# Patient Record
Sex: Female | Born: 1940 | Race: White | Hispanic: No | Smoking: Former smoker
Health system: Southern US, Community
[De-identification: ages and names within clinical notes are randomized; demographics above are authoritative.]

## PROBLEM LIST (undated history)

## (undated) DIAGNOSIS — E039 Hypothyroidism, unspecified: Secondary | ICD-10-CM

## (undated) DIAGNOSIS — M1990 Unspecified osteoarthritis, unspecified site: Secondary | ICD-10-CM

## (undated) DIAGNOSIS — D649 Anemia, unspecified: Secondary | ICD-10-CM

## (undated) HISTORY — PX: ABDOMINAL HYSTERECTOMY: SHX81

## (undated) HISTORY — PX: FOOT FOREIGN BODY REMOVAL: SUR1116

## (undated) HISTORY — PX: APPENDECTOMY: SHX54

---

## 2013-01-13 ENCOUNTER — Other Ambulatory Visit (HOSPITAL_COMMUNITY): Payer: Self-pay | Admitting: Orthopaedic Surgery

## 2013-03-25 ENCOUNTER — Encounter (HOSPITAL_COMMUNITY): Payer: Self-pay | Admitting: Pharmacy Technician

## 2013-03-28 ENCOUNTER — Encounter (HOSPITAL_COMMUNITY)
Admission: RE | Admit: 2013-03-28 | Discharge: 2013-03-28 | Disposition: A | Discharge status: Home / Self Care | Payer: Medicare Other | Source: Ambulatory Visit | Attending: Orthopaedic Surgery | Admitting: Orthopaedic Surgery

## 2013-03-28 ENCOUNTER — Encounter (HOSPITAL_COMMUNITY): Payer: Self-pay

## 2013-03-28 HISTORY — DX: Unspecified osteoarthritis, unspecified site: M19.90

## 2013-03-28 HISTORY — DX: Anemia, unspecified: D64.9

## 2013-03-28 LAB — BASIC METABOLIC PANEL
CO2: 25 mEq/L (ref 19–32)
GFR calc non Af Amer: 83 mL/min — ABNORMAL LOW (ref >90)
Glucose, Bld: 89 mg/dL (ref 70–99)
Potassium: 5.3 mEq/L — ABNORMAL HIGH (ref 3.5–5.1)
Sodium: 137 mEq/L (ref 135–145)

## 2013-03-28 LAB — URINE MICROSCOPIC-ADD ON

## 2013-03-28 LAB — URINALYSIS, ROUTINE W REFLEX MICROSCOPIC
Glucose, UA: NEGATIVE mg/dL
Ketones, ur: 15 mg/dL — AB
Leukocytes, UA: NEGATIVE
Nitrite: POSITIVE — AB
Specific Gravity, Urine: 1.02 (ref 1.005–1.030)
pH: 5.5 (ref 5.0–8.0)

## 2013-03-28 LAB — SURGICAL PCR SCREEN: Staphylococcus aureus: NEGATIVE

## 2013-03-28 LAB — ABO/RH: ABO/RH(D): A POS

## 2013-03-28 LAB — CBC
Hemoglobin: 14.6 g/dL (ref 12.0–15.0)
MCH: 31.6 pg (ref 26.0–34.0)
RBC: 4.62 MIL/uL (ref 3.87–5.11)
WBC: 7.2 10*3/uL (ref 4.0–10.5)

## 2013-03-28 NOTE — Pre-Procedure Instructions (Signed)
Ann Rangel  03/28/2013   Your procedure is scheduled on:  04/05/13  Report to [facility] at 1030 AM.  Call this number if you have problems the morning of surgery: [phone]   Remember:   Do not eat food or drink liquids after midnight.   Take these medicines the morning of surgery with A SIP OF WATER: hydrocodone   Do not wear jewelry, make-up or nail polish.  Do not wear lotions, powders, or perfumes. You may wear deodorant.  Do not shave 48 hours prior to surgery. Men may shave face and neck.  Do not bring valuables to the hospital.  [facility] is not responsible                   for any belongings or valuables.  Contacts, dentures or bridgework may not be worn into surgery.  Leave suitcase in the car. After surgery it may be brought to your room.  For patients admitted to the hospital, checkout time is 11:00 AM the day of  discharge.   Patients discharged the day of surgery will not be allowed to drive  home.  Name and phone number of your driver: family  Special Instructions: Shower using CHG 2 nights before surgery and the night before surgery.  If you shower the day of surgery use CHG.  Use special wash - you have one bottle of CHG for all showers.  You should use approximately 1/3 of the bottle for each shower.   Please read over the following fact sheets that you were given: Pain Booklet, Coughing and Deep Breathing, Blood Transfusion Information, MRSA Information and Surgical Site Infection Prevention

## 2013-03-28 NOTE — Progress Notes (Signed)
K  5.3  Will review with [place] PA

## 2013-03-29 NOTE — Progress Notes (Signed)
Anesthesia Chart Review:  Patient is a 72 year old female scheduled for right TKA on 04/05/13 by Dr. Magnus Ivan.  History includes arthritis, anemia, former smoker, obesity, hysterectomy.  Preoperative labs noted.  K+ 5.3, Cr 0.77.  CBC WNL.  Preliminary urine culture showed gram negative rods--result called to Sherrie at Dr. Eliberto Ivory office.  PT/PTT were not done at her PAT visit, so these will need to be done on the day of surgery.    Velna Ochs [facility]/Anesthesiology Phone [phone] 03/29/2013 2:11 PM

## 2013-03-30 LAB — URINE CULTURE: Colony Count: 70000

## 2013-04-04 MED ORDER — CLINDAMYCIN PHOSPHATE 900 MG/50ML IV SOLN
900.0000 mg | INTRAVENOUS | Status: AC
  Administered 2013-04-05: 900 mg via INTRAVENOUS
  Filled 2013-04-04 (×2): qty 50

## 2013-04-05 ENCOUNTER — Inpatient Hospital Stay (HOSPITAL_COMMUNITY): Payer: Medicare Other | Admitting: Anesthesiology

## 2013-04-05 ENCOUNTER — Inpatient Hospital Stay (HOSPITAL_COMMUNITY): Payer: Medicare Other

## 2013-04-05 ENCOUNTER — Encounter (HOSPITAL_COMMUNITY)
Admission: RE | Disposition: A | Discharge status: Home / Self Care | Payer: Self-pay | Source: Ambulatory Visit | Attending: Orthopaedic Surgery

## 2013-04-05 ENCOUNTER — Inpatient Hospital Stay (HOSPITAL_COMMUNITY)
Admission: RE | Admit: 2013-04-05 | Discharge: 2013-04-08 | DRG: 470 | Disposition: A | Discharge status: Home / Self Care | Payer: Medicare Other | Source: Ambulatory Visit | Attending: Orthopaedic Surgery | Admitting: Orthopaedic Surgery

## 2013-04-05 ENCOUNTER — Encounter (HOSPITAL_COMMUNITY): Payer: Self-pay | Admitting: Vascular Surgery

## 2013-04-05 ENCOUNTER — Encounter (HOSPITAL_COMMUNITY): Payer: Self-pay | Admitting: *Deleted

## 2013-04-05 DIAGNOSIS — M1710 Unilateral primary osteoarthritis, unspecified knee: Principal | ICD-10-CM | POA: Diagnosis present

## 2013-04-05 DIAGNOSIS — Z87891 Personal history of nicotine dependence: Secondary | ICD-10-CM

## 2013-04-05 DIAGNOSIS — D62 Acute posthemorrhagic anemia: Secondary | ICD-10-CM | POA: Diagnosis not present

## 2013-04-05 DIAGNOSIS — Z01812 Encounter for preprocedural laboratory examination: Secondary | ICD-10-CM

## 2013-04-05 DIAGNOSIS — M1711 Unilateral primary osteoarthritis, right knee: Secondary | ICD-10-CM

## 2013-04-05 HISTORY — PX: TOTAL KNEE ARTHROPLASTY: SHX125

## 2013-04-05 LAB — PROTIME-INR
INR: 1.05 (ref 0.00–1.49)
Prothrombin Time: 13.6 seconds (ref 11.6–15.2)

## 2013-04-05 LAB — APTT: aPTT: 32 seconds (ref 24–37)

## 2013-04-05 SURGERY — ARTHROPLASTY, KNEE, TOTAL
Anesthesia: Choice | Site: Knee | Laterality: Right | Wound class: Clean

## 2013-04-05 MED ORDER — HYDROMORPHONE HCL PF 1 MG/ML IJ SOLN
0.2500 mg | INTRAMUSCULAR | Status: DC | PRN
  Administered 2013-04-05: 0.5 mg via INTRAVENOUS

## 2013-04-05 MED ORDER — PROPOFOL 10 MG/ML IV BOLUS
INTRAVENOUS | Status: DC | PRN
  Administered 2013-04-05: 200 mg via INTRAVENOUS

## 2013-04-05 MED ORDER — LIDOCAINE HCL (CARDIAC) 20 MG/ML IV SOLN
INTRAVENOUS | Status: DC | PRN
  Administered 2013-04-05: 100 mg via INTRAVENOUS

## 2013-04-05 MED ORDER — FENTANYL CITRATE 0.05 MG/ML IJ SOLN: 50.0000 ug | INTRAMUSCULAR | Status: DC | PRN

## 2013-04-05 MED ORDER — DOCUSATE SODIUM 100 MG PO CAPS
100.0000 mg | ORAL_CAPSULE | Freq: Two times a day (BID) | ORAL | Status: DC
  Administered 2013-04-05 – 2013-04-08 (×6): 100 mg via ORAL
  Filled 2013-04-05 (×7): qty 1

## 2013-04-05 MED ORDER — ACETAMINOPHEN 325 MG PO TABS
650.0000 mg | ORAL_TABLET | Freq: Four times a day (QID) | ORAL | Status: DC | PRN
  Administered 2013-04-06 – 2013-04-08 (×5): 650 mg via ORAL
  Filled 2013-04-05 (×5): qty 2

## 2013-04-05 MED ORDER — METHOCARBAMOL 100 MG/ML IJ SOLN
500.0000 mg | Freq: Four times a day (QID) | INTRAVENOUS | Status: DC | PRN
  Filled 2013-04-05: qty 5

## 2013-04-05 MED ORDER — CLINDAMYCIN PHOSPHATE 600 MG/50ML IV SOLN
600.0000 mg | Freq: Four times a day (QID) | INTRAVENOUS | Status: AC
  Administered 2013-04-05 – 2013-04-06 (×2): 600 mg via INTRAVENOUS
  Filled 2013-04-05 (×3): qty 50

## 2013-04-05 MED ORDER — MIDAZOLAM HCL 2 MG/2ML IJ SOLN
INTRAMUSCULAR | Status: AC
  Filled 2013-04-05: qty 2

## 2013-04-05 MED ORDER — RIVAROXABAN 10 MG PO TABS
10.0000 mg | ORAL_TABLET | Freq: Every day | ORAL | Status: DC
  Administered 2013-04-06 – 2013-04-08 (×3): 10 mg via ORAL
  Filled 2013-04-05 (×5): qty 1

## 2013-04-05 MED ORDER — HYDROMORPHONE HCL PF 1 MG/ML IJ SOLN
1.0000 mg | INTRAMUSCULAR | Status: DC | PRN
  Administered 2013-04-06 – 2013-04-07 (×2): 1 mg via INTRAVENOUS
  Filled 2013-04-05 (×2): qty 1

## 2013-04-05 MED ORDER — OXYCODONE HCL ER 10 MG PO T12A
10.0000 mg | EXTENDED_RELEASE_TABLET | Freq: Two times a day (BID) | ORAL | Status: DC
  Administered 2013-04-05 – 2013-04-06 (×3): 10 mg via ORAL
  Filled 2013-04-05 (×4): qty 1

## 2013-04-05 MED ORDER — METOCLOPRAMIDE HCL 5 MG/ML IJ SOLN: 5.0000 mg | Freq: Three times a day (TID) | INTRAMUSCULAR | Status: DC | PRN

## 2013-04-05 MED ORDER — SODIUM CHLORIDE 0.9 % IR SOLN
Status: DC | PRN
  Administered 2013-04-05: 3000 mL
  Administered 2013-04-05: 1000 mL

## 2013-04-05 MED ORDER — FENTANYL CITRATE 0.05 MG/ML IJ SOLN
INTRAMUSCULAR | Status: DC | PRN
  Administered 2013-04-05: 50 ug via INTRAVENOUS
  Administered 2013-04-05 (×2): 100 ug via INTRAVENOUS
  Administered 2013-04-05 (×2): 50 ug via INTRAVENOUS

## 2013-04-05 MED ORDER — ONDANSETRON HCL 4 MG/2ML IJ SOLN
INTRAMUSCULAR | Status: DC | PRN
  Administered 2013-04-05: 4 mg via INTRAVENOUS

## 2013-04-05 MED ORDER — METHOCARBAMOL 500 MG PO TABS
ORAL_TABLET | ORAL | Status: AC
  Administered 2013-04-05: 500 mg via ORAL
  Filled 2013-04-05: qty 1

## 2013-04-05 MED ORDER — HYDROMORPHONE HCL PF 1 MG/ML IJ SOLN
INTRAMUSCULAR | Status: AC
  Administered 2013-04-05: 0.5 mg via INTRAVENOUS
  Filled 2013-04-05: qty 1

## 2013-04-05 MED ORDER — SODIUM CHLORIDE 0.9 % IV SOLN: INTRAVENOUS | Status: DC

## 2013-04-05 MED ORDER — METOCLOPRAMIDE HCL 10 MG PO TABS: 5.0000 mg | ORAL_TABLET | Freq: Three times a day (TID) | ORAL | Status: DC | PRN

## 2013-04-05 MED ORDER — DIPHENHYDRAMINE HCL 12.5 MG/5ML PO ELIX: 12.5000 mg | ORAL_SOLUTION | ORAL | Status: DC | PRN

## 2013-04-05 MED ORDER — OXYCODONE HCL 5 MG/5ML PO SOLN: 5.0000 mg | Freq: Once | ORAL | Status: AC | PRN

## 2013-04-05 MED ORDER — VITAMIN B-12 1000 MCG PO TABS
1000.0000 ug | ORAL_TABLET | Freq: Every day | ORAL | Status: DC
  Administered 2013-04-05 – 2013-04-08 (×4): 1000 ug via ORAL
  Filled 2013-04-05 (×5): qty 1

## 2013-04-05 MED ORDER — MENTHOL 3 MG MT LOZG: 1.0000 | LOZENGE | OROMUCOSAL | Status: DC | PRN

## 2013-04-05 MED ORDER — OXYCODONE HCL 5 MG PO TABS
ORAL_TABLET | ORAL | Status: AC
  Administered 2013-04-05: 5 mg via ORAL
  Filled 2013-04-05: qty 1

## 2013-04-05 MED ORDER — METHOCARBAMOL 500 MG PO TABS
500.0000 mg | ORAL_TABLET | Freq: Four times a day (QID) | ORAL | Status: DC | PRN
  Administered 2013-04-05 – 2013-04-07 (×4): 500 mg via ORAL
  Filled 2013-04-05 (×5): qty 1

## 2013-04-05 MED ORDER — ONDANSETRON HCL 4 MG PO TABS: 4.0000 mg | ORAL_TABLET | Freq: Four times a day (QID) | ORAL | Status: DC | PRN

## 2013-04-05 MED ORDER — FERROUS SULFATE 325 (65 FE) MG PO TABS
325.0000 mg | ORAL_TABLET | Freq: Three times a day (TID) | ORAL | Status: DC
  Administered 2013-04-06 – 2013-04-08 (×5): 325 mg via ORAL
  Filled 2013-04-05 (×11): qty 1

## 2013-04-05 MED ORDER — MIDAZOLAM HCL 2 MG/2ML IJ SOLN: 1.0000 mg | INTRAMUSCULAR | Status: DC | PRN

## 2013-04-05 MED ORDER — ONDANSETRON HCL 4 MG/2ML IJ SOLN
4.0000 mg | Freq: Four times a day (QID) | INTRAMUSCULAR | Status: DC | PRN
  Administered 2013-04-05 – 2013-04-06 (×2): 4 mg via INTRAVENOUS
  Filled 2013-04-05 (×2): qty 2

## 2013-04-05 MED ORDER — OXYCODONE HCL 5 MG PO TABS
5.0000 mg | ORAL_TABLET | ORAL | Status: DC | PRN
  Administered 2013-04-05 – 2013-04-06 (×5): 10 mg via ORAL
  Administered 2013-04-06 – 2013-04-08 (×3): 5 mg via ORAL
  Filled 2013-04-05: qty 1
  Filled 2013-04-05 (×2): qty 2
  Filled 2013-04-05: qty 1
  Filled 2013-04-05 (×4): qty 2

## 2013-04-05 MED ORDER — LACTATED RINGERS IV SOLN
INTRAVENOUS | Status: DC
  Administered 2013-04-05 (×2): via INTRAVENOUS

## 2013-04-05 MED ORDER — ACETAMINOPHEN 650 MG RE SUPP: 650.0000 mg | Freq: Four times a day (QID) | RECTAL | Status: DC | PRN

## 2013-04-05 MED ORDER — BISACODYL 10 MG RE SUPP: 10.0000 mg | Freq: Every day | RECTAL | Status: DC | PRN

## 2013-04-05 MED ORDER — ZOLPIDEM TARTRATE 5 MG PO TABS: 5.0000 mg | ORAL_TABLET | Freq: Every evening | ORAL | Status: DC | PRN

## 2013-04-05 MED ORDER — FENTANYL CITRATE 0.05 MG/ML IJ SOLN
INTRAMUSCULAR | Status: AC
  Filled 2013-04-05: qty 2

## 2013-04-05 MED ORDER — POLYETHYLENE GLYCOL 3350 17 G PO PACK
17.0000 g | PACK | Freq: Every day | ORAL | Status: DC | PRN
  Filled 2013-04-05: qty 1

## 2013-04-05 MED ORDER — ONDANSETRON HCL 4 MG/2ML IJ SOLN: 4.0000 mg | Freq: Once | INTRAMUSCULAR | Status: DC | PRN

## 2013-04-05 MED ORDER — OXYCODONE HCL 5 MG PO TABS: 5.0000 mg | ORAL_TABLET | Freq: Once | ORAL | Status: AC | PRN

## 2013-04-05 MED ORDER — ALUM & MAG HYDROXIDE-SIMETH 200-200-20 MG/5ML PO SUSP
30.0000 mL | ORAL | Status: DC | PRN
  Administered 2013-04-07: 30 mL via ORAL
  Filled 2013-04-05 (×2): qty 30

## 2013-04-05 MED ORDER — ARTIFICIAL TEARS OP OINT
TOPICAL_OINTMENT | OPHTHALMIC | Status: DC | PRN
  Administered 2013-04-05: 1 via OPHTHALMIC

## 2013-04-05 MED ORDER — PHENOL 1.4 % MT LIQD: 1.0000 | OROMUCOSAL | Status: DC | PRN

## 2013-04-05 MED ORDER — PHENYLEPHRINE HCL 10 MG/ML IJ SOLN
INTRAMUSCULAR | Status: DC | PRN
  Administered 2013-04-05: 160 ug via INTRAVENOUS
  Administered 2013-04-05 (×4): 120 ug via INTRAVENOUS
  Administered 2013-04-05: 160 ug via INTRAVENOUS
  Administered 2013-04-05: 120 ug via INTRAVENOUS
  Administered 2013-04-05: 160 ug via INTRAVENOUS

## 2013-04-05 MED ORDER — MIDAZOLAM HCL 5 MG/5ML IJ SOLN
INTRAMUSCULAR | Status: DC | PRN
  Administered 2013-04-05: 2 mg via INTRAVENOUS

## 2013-04-05 MED ORDER — BUPIVACAINE-EPINEPHRINE PF 0.5-1:200000 % IJ SOLN
INTRAMUSCULAR | Status: DC | PRN
  Administered 2013-04-05: 25 mL

## 2013-04-05 SURGICAL SUPPLY — 69 items
ADH SKN CLS APL DERMABOND .7 (GAUZE/BANDAGES/DRESSINGS) ×1
BANDAGE ELASTIC 4 VELCRO ST LF (GAUZE/BANDAGES/DRESSINGS) ×1 IMPLANT
BANDAGE ELASTIC 6 VELCRO ST LF (GAUZE/BANDAGES/DRESSINGS) ×3 IMPLANT
BANDAGE ESMARK 6X9 LF (GAUZE/BANDAGES/DRESSINGS) ×1 IMPLANT
BLADE SAG 18X100X1.27 (BLADE) ×4 IMPLANT
BLADE SAW SGTL 13.0X1.19X90.0M (BLADE) ×2 IMPLANT
BNDG CMPR 9X6 STRL LF SNTH (GAUZE/BANDAGES/DRESSINGS) ×1
BNDG ESMARK 6X9 LF (GAUZE/BANDAGES/DRESSINGS) ×2
BOWL SMART MIX CTS (DISPOSABLE) ×1 IMPLANT
CEMENT BONE SIMPLEX SPEEDSET (Cement) ×2 IMPLANT
CLOTH BEACON ORANGE TIMEOUT ST (SAFETY) ×2 IMPLANT
COVER SURGICAL LIGHT HANDLE (MISCELLANEOUS) ×2 IMPLANT
CUFF TOURNIQUET SINGLE 34IN LL (TOURNIQUET CUFF) IMPLANT
CUFF TOURNIQUET SINGLE 44IN (TOURNIQUET CUFF) IMPLANT
DERMABOND ADVANCED (GAUZE/BANDAGES/DRESSINGS) ×1
DERMABOND ADVANCED .7 DNX12 (GAUZE/BANDAGES/DRESSINGS) IMPLANT
DRAPE INCISE IOBAN 66X45 STRL (DRAPES) ×2 IMPLANT
DRAPE ORTHO SPLIT 77X108 STRL (DRAPES) ×4
DRAPE PROXIMA HALF (DRAPES) ×2 IMPLANT
DRAPE SURG ORHT 6 SPLT 77X108 (DRAPES) ×2 IMPLANT
DRAPE U-SHAPE 47X51 STRL (DRAPES) ×2 IMPLANT
DRESSING OPSITE X SMALL 2X3 (GAUZE/BANDAGES/DRESSINGS) ×1 IMPLANT
DRSG MEPILEX BORDER 4X12 (GAUZE/BANDAGES/DRESSINGS) ×1 IMPLANT
DRSG PAD ABDOMINAL 8X10 ST (GAUZE/BANDAGES/DRESSINGS) ×2 IMPLANT
DURAPREP 26ML APPLICATOR (WOUND CARE) ×2 IMPLANT
ELECT REM PT RETURN 9FT ADLT (ELECTROSURGICAL) ×2
ELECTRODE REM PT RTRN 9FT ADLT (ELECTROSURGICAL) ×1 IMPLANT
EVACUATOR 1/8 PVC DRAIN (DRAIN) ×2 IMPLANT
FACESHIELD LNG OPTICON STERILE (SAFETY) ×6 IMPLANT
GAUZE XEROFORM 1X8 LF (GAUZE/BANDAGES/DRESSINGS) ×2 IMPLANT
GLOVE BIO SURGEON STRL SZ7.5 (GLOVE) ×4 IMPLANT
GLOVE BIOGEL PI IND STRL 8 (GLOVE) ×1 IMPLANT
GLOVE BIOGEL PI INDICATOR 8 (GLOVE) ×1
GLOVE ECLIPSE 7.0 STRL STRAW (GLOVE) ×2 IMPLANT
GLOVE ORTHO TXT STRL SZ7.5 (GLOVE) ×2 IMPLANT
GOWN PREVENTION PLUS LG XLONG (DISPOSABLE) IMPLANT
GOWN PREVENTION PLUS XLARGE (GOWN DISPOSABLE) ×4 IMPLANT
GOWN STRL NON-REIN LRG LVL3 (GOWN DISPOSABLE) ×4 IMPLANT
HANDPIECE INTERPULSE COAX TIP (DISPOSABLE) ×2
IMMOBILIZER KNEE 22 UNIV (SOFTGOODS) ×2 IMPLANT
KIT BASIN OR (CUSTOM PROCEDURE TRAY) ×2 IMPLANT
KIT ROOM TURNOVER OR (KITS) ×2 IMPLANT
KNEE/VIT E POLY LINER LEVEL 1B ×1 IMPLANT
MANIFOLD NEPTUNE II (INSTRUMENTS) ×2 IMPLANT
NDL SPNL 18GX3.5 QUINCKE PK (NEEDLE) ×1 IMPLANT
NEEDLE SPNL 18GX3.5 QUINCKE PK (NEEDLE) ×2 IMPLANT
NS IRRIG 1000ML POUR BTL (IV SOLUTION) ×2 IMPLANT
PACK TOTAL JOINT (CUSTOM PROCEDURE TRAY) ×2 IMPLANT
PAD ARMBOARD 7.5X6 YLW CONV (MISCELLANEOUS) ×4 IMPLANT
PADDING CAST COTTON 6X4 STRL (CAST SUPPLIES) ×2 IMPLANT
SET HNDPC FAN SPRY TIP SCT (DISPOSABLE) IMPLANT
SET PAD KNEE POSITIONER (MISCELLANEOUS) ×2 IMPLANT
SPONGE GAUZE 4X4 12PLY (GAUZE/BANDAGES/DRESSINGS) ×2 IMPLANT
STAPLER VISISTAT 35W (STAPLE) IMPLANT
STRIP CLOSURE SKIN 1/2X4 (GAUZE/BANDAGES/DRESSINGS) ×4 IMPLANT
SUCTION FRAZIER TIP 10 FR DISP (SUCTIONS) ×1 IMPLANT
SUT ETHILON 3 0 PS 1 (SUTURE) ×2 IMPLANT
SUT MNCRL AB 3-0 PS2 18 (SUTURE) ×1 IMPLANT
SUT VIC AB 0 CT1 27 (SUTURE) ×2
SUT VIC AB 0 CT1 27XBRD ANBCTR (SUTURE) IMPLANT
SUT VIC AB 1 CT1 27 (SUTURE) ×4
SUT VIC AB 1 CT1 27XBRD ANBCTR (SUTURE) ×2 IMPLANT
SUT VIC AB 2-0 CT1 27 (SUTURE) ×4
SUT VIC AB 2-0 CT1 TAPERPNT 27 (SUTURE) ×2 IMPLANT
SUT VIC AB 4-0 PS2 27 (SUTURE) ×2 IMPLANT
TOWEL OR 17X24 6PK STRL BLUE (TOWEL DISPOSABLE) ×2 IMPLANT
TOWEL OR 17X26 10 PK STRL BLUE (TOWEL DISPOSABLE) ×2 IMPLANT
TRAY FOLEY CATH 14FR (SET/KITS/TRAYS/PACK) ×1 IMPLANT
WATER STERILE IRR 1000ML POUR (IV SOLUTION) ×4 IMPLANT

## 2013-04-05 NOTE — Progress Notes (Signed)
Orthopedic Tech Progress Note Patient Details:  Ann Rangel [birth year] [id number] Applied overhead frame. CPM Right Knee CPM Right Knee: On Right Knee Flexion (Degrees): 60 Right Knee Extension (Degrees): 0   [name] 04/05/2013, 3:45 PM

## 2013-04-05 NOTE — Brief Op Note (Signed)
04/05/2013  2:37 PM  PATIENT:  Ann Rangel  72 y.o. female  PRE-OPERATIVE DIAGNOSIS:  Severe osteoarthritis right knee  POST-OPERATIVE DIAGNOSIS:  Severe osteoarthritis right knee   PROCEDURE:  Procedure(s): RIGHT TOTAL KNEE ARTHROPLASTY (Right)  SURGEON:  Surgeon(s) and Role:    * Kathryne Hitch, MD - Primary  PHYSICIAN ASSISTANT: Rexene Edison, PA-C  ANESTHESIA:   regional and general  EBL:  Total I/O In: 1000 [I.V.:1000] Out: 200 [Urine:100; Blood:100]  BLOOD ADMINISTERED:none  DRAINS: (medium) Hemovact drain(s) in the knee joint with  Suction Open   LOCAL MEDICATIONS USED:  NONE  SPECIMEN:  No Specimen  DISPOSITION OF SPECIMEN:  N/A  COUNTS:  YES  TOURNIQUET:   Total Tourniquet Time Documented: Thigh (Right) - 66 minutes Total: Thigh (Right) - 66 minutes   DICTATION: .Other Dictation: Dictation Number [id number]  PLAN OF CARE: Admit to inpatient   PATIENT DISPOSITION:  PACU - hemodynamically stable.   Delay start of Pharmacological VTE agent (>24hrs) due to surgical blood loss or risk of bleeding: not applicable

## 2013-04-05 NOTE — Anesthesia Postprocedure Evaluation (Signed)
  Anesthesia Post-op Note  Patient: Ann Rangel  Procedure(s) Performed: Procedure(s): RIGHT TOTAL KNEE ARTHROPLASTY (Right)  Patient Location: PACU  Anesthesia Type:GA combined with regional for post-op pain  Level of Consciousness: awake, oriented and patient cooperative  Airway and Oxygen Therapy: Patient Spontanous Breathing  Post-op Pain: moderate  Post-op Assessment: Post-op Vital signs reviewed, Patient's Cardiovascular Status Stable, Respiratory Function Stable, Patent Airway, No signs of Nausea or vomiting and Pain level controlled  Post-op Vital Signs: stable  Complications: No apparent anesthesia complications

## 2013-04-05 NOTE — Progress Notes (Signed)
UR COMPLETED  

## 2013-04-05 NOTE — Anesthesia Procedure Notes (Addendum)
Anesthesia Regional Block:  Femoral nerve block  Pre-Anesthetic Checklist: ,, timeout performed, Correct Patient, Correct Site, Correct Laterality, Correct Procedure, Correct Position, site marked, Risks and benefits discussed,  Surgical consent,  Pre-op evaluation,  At surgeon's request and post-op pain management  Laterality: Right and Lower  Prep: chloraprep       Needles:  Injection technique: Single-shot  Needle Type: Echogenic Needle     Needle Length: 9cm  Needle Gauge: 22 and 22 G    Additional Needles:  Procedures: ultrasound guided (picture in chart) Femoral nerve block Narrative:  Start time: 04/05/2013 12:34 PM End time: 04/05/2013 12:40 PM Injection made incrementally with aspirations every 5 mL.  Performed by: Personally  Anesthesiologist: Sheldon Silvan, MD  Femoral nerve block Procedure Name: LMA Insertion Date/Time: 04/05/2013 12:51 PM Performed by: De Nurse Pre-anesthesia Checklist: Patient identified, Timeout performed, Emergency Drugs available, Suction available and Patient being monitored Patient Re-evaluated:Patient Re-evaluated prior to inductionOxygen Delivery Method: Circle system utilized Preoxygenation: Pre-oxygenation with 100% oxygen Intubation Type: IV induction LMA: LMA inserted LMA Size: 5.0 Number of attempts: 1 Tube secured with: Tape Dental Injury: Teeth and Oropharynx as per pre-operative assessment

## 2013-04-05 NOTE — Preoperative (Signed)
Beta Blockers   Reason not to administer Beta Blockers:Not Applicable 

## 2013-04-05 NOTE — Anesthesia Preprocedure Evaluation (Signed)

## 2013-04-05 NOTE — Transfer of Care (Signed)
Immediate Anesthesia Transfer of Care Note  Patient: Ann Rangel  Procedure(s) Performed: Procedure(s): RIGHT TOTAL KNEE ARTHROPLASTY (Right)  Patient Location: PACU  Anesthesia Type:General  Level of Consciousness: awake, alert  and oriented  Airway & Oxygen Therapy: Patient Spontanous Breathing and Patient connected to nasal cannula oxygen  Post-op Assessment: Report given to PACU RN  Post vital signs: Reviewed and stable  Complications: No apparent anesthesia complications

## 2013-04-05 NOTE — H&P (Signed)
TOTAL KNEE ADMISSION H&P  Patient is being admitted for right total knee arthroplasty.  Subjective:  Chief Complaint:right knee pain.  HPI: Ann Rangel, 72 y.o. female, has a history of pain and functional disability in the right knee due to arthritis and has failed non-surgical conservative treatments for greater than 12 weeks to includeNSAID's and/or analgesics, corticosteriod injections, flexibility and strengthening excercises, use of assistive devices, weight reduction as appropriate and activity modification.  Onset of symptoms was gradual, starting 5 years ago with gradually worsening course since that time. The patient noted no past surgery on the right knee(s).  Patient currently rates pain in the right knee(s) at 8 out of 10 with activity. Patient has night pain, worsening of pain with activity and weight bearing, pain that interferes with activities of daily living, pain with passive range of motion, crepitus and joint swelling.  Patient has evidence of subchondral cysts, subchondral sclerosis, periarticular osteophytes and joint space narrowing by imaging studies. There is no active infection.  Patient Active Problem List   Diagnosis Date Noted  . Arthritis of knee, right 04/05/2013   Past Medical History  Diagnosis Date  . Arthritis   . Anemia     Past Surgical History  Procedure Laterality Date  . Abdominal hysterectomy    . Foot foreign body removal      Prescriptions prior to admission  Medication Sig Dispense Refill  . B Complex-C (SUPER B COMPLEX PO) Take 1 tablet by mouth daily.       . Calcium Carbonate-Vitamin D (CALTRATE 600+D PO) Take 1 tablet by mouth daily.       . Cholecalciferol (VITAMIN D-3 PO) Take 1 tablet by mouth daily.       . diclofenac sodium (VOLTAREN) 1 % GEL Apply 2 g topically daily as needed. Apply to knees for pain/inflammation      . ferrous sulfate 325 (65 FE) MG tablet Take 325 mg by mouth every other day.      Marland Kitchen HYDROcodone-acetaminophen  (NORCO/VICODIN) 5-325 MG per tablet Take 0.5 tablets by mouth every 12 (twelve) hours as needed for pain. For pain      . ibuprofen (ADVIL,MOTRIN) 200 MG tablet Take 400 mg by mouth daily as needed for pain. For pain      . Iron-Vitamins (GERITOL PO) Take 1 tablet by mouth daily.      . meloxicam (MOBIC) 7.5 MG tablet Take 7.5 mg by mouth daily.      . vitamin B-12 (CYANOCOBALAMIN) 1000 MCG tablet Take 1,000 mcg by mouth daily.      Marland Kitchen VITAMIN E PO Take 1 tablet by mouth daily.        Allergies  Allergen Reactions  . Penicillins Hives    History  Substance Use Topics  . Smoking status: Former Games developer  . Smokeless tobacco: Not on file  . Alcohol Use: No    History reviewed. No pertinent family history.   Review of Systems  Musculoskeletal: Positive for joint pain.  All other systems reviewed and are negative.    Objective:  Physical Exam  Constitutional: She is oriented to person, place, and time. She appears well-developed and well-nourished.  HENT:  Head: Normocephalic and atraumatic.  Eyes: EOM are normal. Pupils are equal, round, and reactive to light.  Neck: Normal range of motion. Neck supple.  Cardiovascular: Normal rate and regular rhythm.   Respiratory: Effort normal and breath sounds normal.  GI: Soft. Bowel sounds are normal.  Musculoskeletal:  Right knee: She exhibits swelling and effusion. Tenderness found. Medial joint line and lateral joint line tenderness noted.  Neurological: She is alert and oriented to person, place, and time.  Skin: Skin is warm and dry.  Psychiatric: She has a normal mood and affect.    Vital signs in last 24 hours: Temp:  [97.6 F (36.4 C)] 97.6 F (36.4 C) (06/10 1050) Pulse Rate:  [79] 79 (06/10 1050) Resp:  [20] 20 (06/10 1050) BP: (123)/(77) 123/77 mmHg (06/10 1050) SpO2:  [96 %] 96 % (06/10 1050)  Labs:   There is no weight on file to calculate BMI.   Imaging Review Plain radiographs demonstrate severe  degenerative joint disease of the right knee(s). The overall alignment ismild varus. The bone quality appears to be good for age and reported activity level.  Assessment/Plan:  End stage arthritis, right knee   The patient history, physical examination, clinical judgment of the provider and imaging studies are consistent with end stage degenerative joint disease of the right knee(s) and total knee arthroplasty is deemed medically necessary. The treatment options including medical management, injection therapy arthroscopy and arthroplasty were discussed at length. The risks and benefits of total knee arthroplasty were presented and reviewed. The risks due to aseptic loosening, infection, stiffness, patella tracking problems, thromboembolic complications and other imponderables were discussed. The patient acknowledged the explanation, agreed to proceed with the plan and consent was signed. Patient is being admitted for inpatient treatment for surgery, pain control, PT, OT, prophylactic antibiotics, VTE prophylaxis, progressive ambulation and ADL's and discharge planning. The patient is planning to be discharged home with home health services

## 2013-04-06 LAB — BASIC METABOLIC PANEL
CO2: 25 mEq/L (ref 19–32)
Chloride: 101 mEq/L (ref 96–112)
Creatinine, Ser: 0.83 mg/dL (ref 0.50–1.10)
GFR calc Af Amer: 80 mL/min — ABNORMAL LOW (ref >90)
Potassium: 3.7 mEq/L (ref 3.5–5.1)
Sodium: 136 mEq/L (ref 135–145)

## 2013-04-06 LAB — CBC
Platelets: 185 10*3/uL (ref 150–400)
RBC: 3.29 MIL/uL — ABNORMAL LOW (ref 3.87–5.11)
RDW: 14 % (ref 11.5–15.5)
WBC: 8.5 10*3/uL (ref 4.0–10.5)

## 2013-04-06 NOTE — Evaluation (Signed)
Physical Therapy Evaluation Patient Details Name: Ann Rangel MRN: [id number] DOB: [birth year] Today's Date: 04/06/2013 Time: 7829-5621 PT Time Calculation (min): 17 min  PT Assessment / Plan / Recommendation Clinical Impression  Pt is a 72 y.o. female s/p R TKA POD#1. Pt presents with mobility deficits, decreased ROM and Strength in R KNee and increased pain in R knee. Will benefit from skilled PT to maximize functional mobility and ensure safe transition to next venue of care. pt plans to D/C home with daughter; discussed with pt the benefits fof ST-SNF if PT goals are not made. Pt is receptive to the idea but ultimately hopes to D/C home.     PT Assessment  Patient needs continued PT services    Follow Up Recommendations  Home health PT;Supervision/Assistance - 24 hour    Does the patient have the potential to tolerate intense rehabilitation      Barriers to Discharge None      Equipment Recommendations  Rolling walker with 5" wheels;Other (comment) (3 in1 commode)    Recommendations for Other Services OT consult   Frequency 7X/week    Precautions / Restrictions Precautions Precautions: Fall;Knee Precaution Booklet Issued: Yes (comment) Precaution Comments: given knee exercise protocol handout Required Braces or Orthoses: Knee Immobilizer - Right Knee Immobilizer - Right: Other (comment) (MD has not specified ) Restrictions Weight Bearing Restrictions: Yes RLE Weight Bearing: Weight bearing as tolerated   Pertinent Vitals/Pain Denies pain; c/o nausea. RN notified.       Mobility  Bed Mobility Bed Mobility: Supine to Sit;Sitting - Scoot to Edge of Bed Supine to Sit: 4: Min assist;HOB elevated;With rails Sitting - Scoot to Edge of Bed: 4: Min assist Details for Bed Mobility Assistance: transfered to EOB on L side of bed(A) to advance R LE to/off EOB and control descent of R LE to floor; pt required verbal cues for hand placement and sequencing and hand rails and  increased time due to pain Transfers Transfers: Sit to Stand;Stand to Sit Sit to Stand: 1: +2 Total assist;From bed;From elevated surface;With upper extremity assist Sit to Stand: Patient Percentage: 60% Stand to Sit: 1: +2 Total assist;To chair/3-in-1;With upper extremity assist;With armrests Stand to Sit: Patient Percentage: 70% Details for Transfer Assistance: pt required 2+ (A) due to pain in R LE and decreased ability to WB through RLE secondary to pain; pt unsteady intially but no LOB; required verbal cues for hand placement and safety with RW; initailly pt was standing with trunk flexed; correctable with verbal cues  Ambulation/Gait Ambulation/Gait Assistance: 3: Mod assist Ambulation Distance (Feet): 8 Feet Assistive device: Rolling walker Ambulation/Gait Assistance Details: continuous verbal cues for upright posture, gt sequencing; pt demo difficulty advance R LE and required facilitation ~ 50% of time; pt c/o dizziness and nauseousness and required sitting rest break.   Gait Pattern: Step-to pattern;Decreased stance time - right;Decreased step length - left;Antalgic;Trunk flexed Gait velocity: decreased Stairs: No Wheelchair Mobility Wheelchair Mobility: No    Exercises Total Joint Exercises Ankle Circles/Pumps: AROM;Both;10 reps;Supine Heel Slides: AAROM;Right;10 reps;Supine (limited by pain ) Hip ABduction/ADduction: AAROM;Right;10 reps;Seated Knee Flexion: AAROM;Right;10 reps;Supine Goniometric ROM: 4 to 45 in supine AAROM limited by pain   PT Diagnosis: Difficulty walking;Acute pain  PT Problem List: Decreased strength;Decreased range of motion;Decreased balance;Decreased mobility;Decreased activity tolerance;Decreased knowledge of use of DME;Pain PT Treatment Interventions: DME instruction;Gait training;Stair training;Functional mobility training;Therapeutic activities;Therapeutic exercise;Balance training;Neuromuscular re-education;Patient/family education   PT  Goals Acute Rehab PT Goals PT Goal Formulation: With patient Time For Goal  Achievement: 04/13/13 Potential to Achieve Goals: Good Pt will go Supine/Side to Sit: with modified independence PT Goal: Supine/Side to Sit - Progress: Goal set today Pt will go Sit to Supine/Side: with modified independence PT Goal: Sit to Supine/Side - Progress: Goal set today Pt will go Stand to Sit: with modified independence PT Goal: Stand to Sit - Progress: Goal set today Pt will Ambulate: with supervision;with rolling walker;>150 feet PT Goal: Ambulate - Progress: Goal set today Pt will Go Up / Down Stairs: 3-5 stairs;with supervision;with rail(s);with least restrictive assistive device PT Goal: Up/Down Stairs - Progress: Goal set today Pt will Perform Home Exercise Program: Independently PT Goal: Perform Home Exercise Program - Progress: Goal set today  Visit Information  Last PT Received On: 04/06/13 Assistance Needed: +2    Subjective Data  Subjective: pt lying supine; reports "i am just so tired and sleepy from all this pain medicince but ill try" Patient Stated Goal: home with daughter    Prior Functioning  Home Living Lives With: Alone Available Help at Discharge: Family;Available 24 hours/day Type of Home: House Home Access: Stairs to enter [employer] of Steps: 4 Entrance Stairs-Rails: Can reach both Home Layout: One level Bathroom Shower/Tub: Forensic scientist: Standard Bathroom Accessibility: Yes How Accessible: Accessible via walker Home Adaptive Equipment: None Prior Function Level of Independence: Independent Able to Take Stairs?: Yes Driving: Yes Vocation: Full time employment Communication Communication: No difficulties Dominant Hand: Right    Cognition  Cognition Arousal/Alertness: Awake/alert Behavior During Therapy: WFL for tasks assessed/performed Overall Cognitive Status: Within Functional Limits for tasks assessed     Extremity/Trunk Assessment Right Lower Extremity Assessment RLE ROM/Strength/Tone: Due to pain;Due to precautions;Unable to fully assess (DF/PF WFl) RLE Sensation: WFL - Light Touch Left Lower Extremity Assessment LLE ROM/Strength/Tone: WFL for tasks assessed LLE Sensation: WFL - Light Touch Trunk Assessment Trunk Assessment: Kyphotic   Balance Balance Balance Assessed: Yes Static Sitting Balance Static Sitting - Balance Support: Right upper extremity supported (R LE unsupported L supported) Static Sitting - Level of Assistance: 5: Stand by assistance Static Sitting - Comment/# of Minutes: pt tolerated sitting EOB ~33min while inital dizziness subsided with deep breathing techniques   End of Session PT - End of Session Equipment Utilized During Treatment: Gait belt;Right knee immobilizer Activity Tolerance: Patient limited by pain;Treatment limited secondary to medication (nausea ) Patient left: in chair;with call bell/phone within reach Nurse Communication: Mobility status;Other (comment) (pt requests medication for nausea ) CPM Right Knee CPM Right Knee: [address]  GP     Donnamarie Poag Brooks, [place] [phone] 04/06/2013, 11:13 AM

## 2013-04-06 NOTE — Progress Notes (Signed)
Subjective: 1 Day Post-Op Procedure(s) (LRB): RIGHT TOTAL KNEE ARTHROPLASTY (Right) Patient reports pain as moderate.    Objective: Vital signs in last 24 hours: Temp:  [97.6 F (36.4 C)-98.6 F (37 C)] 97.7 F (36.5 C) (06/11 0546) Pulse Rate:  [66-86] 86 (06/11 0546) Resp:  [9-20] 18 (06/11 0546) BP: (111-123)/(46-77) 112/46 mmHg (06/11 0546) SpO2:  [93 %-100 %] 98 % (06/11 0546)  Intake/Output from previous day: 06/10 0701 - 06/11 0700 In: 2500 [P.O.:600; I.V.:1900] Out: 1025 [Urine:700; Drains:225; Blood:100] Intake/Output this shift:     Recent Labs  04/06/13 0410  HGB 10.1*    Recent Labs  04/06/13 0410  WBC 8.5  RBC 3.29*  HCT 30.3*  PLT 185    Recent Labs  04/06/13 0410  NA 136  K 3.7  CL 101  CO2 25  BUN 12  CREATININE 0.83  GLUCOSE 106*  CALCIUM 8.2*    Recent Labs  04/05/13 1125  INR 1.05    Sensation intact distally Intact pulses distally Dorsiflexion/Plantar flexion intact Incision: dressing C/D/I No cellulitis present Compartment soft  Assessment/Plan: 1 Day Post-Op Procedure(s) (LRB): RIGHT TOTAL KNEE ARTHROPLASTY (Right) Up with therapy  Keishla Oyer Y 04/06/2013, 7:21 AM

## 2013-04-06 NOTE — Progress Notes (Signed)
Physical Therapy Treatment Patient Details Name: Ann Rangel MRN: [id number] DOB: 02/07/1941 Today's Date: 04/06/2013 Time: 8295-6213 PT Time Calculation (min): 24 min  PT Assessment / Plan / Recommendation Comments on Treatment Session  pt moving well with therapy this afternoon. no c/o dizziniess or nausea this afternoon. Pt able to increase independence with mobility and increase amb distance. Pt is highly motivated to D/C home with family vs [facility]. Will cont to f/u with pt to maximzie functional mobility.'    Follow Up Recommendations  Home health PT;Supervision/Assistance - 24 hour     Does the patient have the potential to tolerate intense rehabilitation     Barriers to Discharge        Equipment Recommendations  Rolling walker with 5" wheels;Other (comment)    Recommendations for Other Services OT consult  Frequency 7X/week   Plan Discharge plan remains appropriate;Frequency remains appropriate    Precautions / Restrictions Precautions Precautions: Fall;Knee Precaution Booklet Issued: Yes (comment) Precaution Comments: given knee exercise protocol handout Required Braces or Orthoses: Knee Immobilizer - Right Knee Immobilizer - Right: Other (comment) (MD has not specified ) Restrictions Weight Bearing Restrictions: Yes RLE Weight Bearing: Weight bearing as tolerated   Pertinent Vitals/Pain 7/10; pt tolerated CPM at 63degrees; pt and family educated on CPM wear schedule and on/off button for CPM     Mobility  Bed Mobility Bed Mobility: Sit to Supine Sit to Supine: 4: Min assist;HOB flat Details for Bed Mobility Assistance: (A) to advance R LE onto bed; verbal cues for hand placement and sequencing; instructed pt on hooklying technique to increase indepence with bed mobility, pt unable to perform at this time. Transfers Transfers: Sit to Stand;Stand to Sit Sit to Stand: 4: Min assist;With armrests;From chair/3-in-1;With upper extremity assist Stand to Sit: 4: Min  assist;To bed;With upper extremity assist;To elevated surface Details for Transfer Assistance: required min (A) to transfer sit <> stand; pt required verbal cues for hand placement and safety; pt tends to attempt to pull to stand with RW correctable with cues; required min (A) to steady and complete transfer due to increased pain and decreased ability to fully WB through R LE  Ambulation/Gait Ambulation/Gait Assistance: 4: Min assist Ambulation Distance (Feet): 30 Feet Assistive device: Rolling walker Ambulation/Gait Assistance Details: verbal cues for upright posture and gt sequencing; demo given for proper gt sequencing to equalize stride length and increase heel strike on R LE; pt required 1 standing rest break due to fatigue in UEs. Gait Pattern: Step-to pattern;Decreased stance time - right;Decreased step length - left;Antalgic;Trunk flexed Gait velocity: decreased Stairs: No Wheelchair Mobility Wheelchair Mobility: No    Exercises Total Joint Exercises Ankle Circles/Pumps: AROM;Both;10 reps;Supine Hip ABduction/ADduction: AAROM;Right;Supine;10 reps Long Arc Quad: AAROM;Right;10 reps;Supine   PT Diagnosis:    PT Problem List:   PT Treatment Interventions:     PT Goals Acute Rehab PT Goals PT Goal Formulation: With patient Time For Goal Achievement: 04/13/13 Potential to Achieve Goals: Good PT Goal: Sit to Supine/Side - Progress: Progressing toward goal PT Goal: Stand to Sit - Progress: Progressing toward goal PT Goal: Ambulate - Progress: Progressing toward goal PT Goal: Perform Home Exercise Program - Progress: Progressing toward goal  Visit Information  Last PT Received On: 04/06/13 Assistance Needed: +1    Subjective Data  Subjective: pt sitting in chair with family present reports that she feels better this afternoon, and is not taking as much pain medicine because it makes her too sick  Patient Stated Goal: home  with family    Cognition   Cognition Arousal/Alertness: Awake/alert Behavior During Therapy: WFL for tasks assessed/performed Overall Cognitive Status: Within Functional Limits for tasks assessed    Balance  Balance Balance Assessed: No  End of Session PT - End of Session Equipment Utilized During Treatment: Gait belt;Right knee immobilizer Activity Tolerance: Patient tolerated treatment well Patient left: in bed;in CPM;with family/visitor present;with call bell/phone within reach Nurse Communication: Mobility status   GP     Donell Sievert, [place] [phone] 04/06/2013, 3:05 PM

## 2013-04-06 NOTE — Op Note (Signed)
NAMETEMPEST, [place]ANK[place] NO.:  [id number]  MEDICAL RECORD NO.:  [id number]  LOCATION:  5N24C                        FACILITY:  [facility]  PHYSICIAN:  Vanita Panda. Magnus Ivan, M.D.DATE OF BIRTH:  18-Aug-1941  DATE OF PROCEDURE:  04/05/2013 DATE OF DISCHARGE:                              OPERATIVE REPORT   PREOPERATIVE DIAGNOSIS:  Severe end-stage arthritis and degenerative joint disease, right knee.  POSTOPERATIVE DIAGNOSIS:  Severe end-stage arthritis and degenerative joint disease, right knee.  PROCEDURE:  Right total knee arthroplasty.  IMPLANTS:  Stryker triathlon knee with size 4 femur, size 5 tibial tray, 9 mm polyethylene insert, size 29 patellar button.  SURGEON:  Vanita Panda. Magnus Ivan, M.D.  ASSISTANT:  Richardean Canal, P.A.  ANESTHESIA: 1. Femoral nerve block, right leg. 2. General.  TOURNIQUET TIME:  Less than 2 hours.  ESTIMATED BLOOD LOSS:  100 mL.  ANTIBIOTICS:  900 mg of IV clindamycin.  COMPLICATIONS:  None.  INDICATIONS:  Ms. Tailor is a 72 year old female that I have known for many years now.  She has severe end-stage arthritis of her right knee with bone-on-bone wear.  She has tried all sorts of conservative treatment including walker, assisted device, activity modification, multiple injections, as well as strengthening exercises, and has gotten to the point were x-rays show complete loss of medial joint line and intractable arthritis in all compartments of the knee.  Given failure of conservative treatment, her daily pain, her decreased mobility and, the impact of these of her quality of life, she wished to proceed with a total knee arthroplasty.  The risks of surgery include acute blood loss anemia, DVT, nerve and vessel injury, infection, fracture.  The goals of surgery are decreased pain, improved mobility, and improved quality of life.  DESCRIPTION OF PROCEDURE:  After informed consent was obtained, right leg was marked.   Anesthesia was obtained with femoral nerve block.  She was then brought to the operating room, placed supine on the operative table.  General anesthesia was then obtained.  Foley catheter placed. Nonsterile tourniquet was placed on her upper right leg.  Her right thigh, leg, foot, and ankle were then prepped and draped with DuraPrep and sterile drapes.  Time-out was called and she was identified as correct patient, correct right leg.  I then used an Esmarch to wrap out the leg and tourniquet was inflated to 300 mm of pressure.  I made a midline incision directly over the patella and carried this proximally and distally and dissected down to the knee joint.  A medial parapatellar arthrotomy was carried out.  We drained decent effusion from the knee.  Then we found the medial compartment to be completely devoid of cartilage with hard sclerotic bone and osteophytes peripherally throughout the knee.  We removed all of these and then first referenced all the tibia for making a tibial cut.  We set our guide for neutral slope, adjusted for varus and valgus and taking about 9 mm off the high side.  Once we made our tibial cut, we felt that she is going to be quite tight, so we took 2 more mm off this.  We then used an intramedullary guide for the femur  and drilled for this.  We used a 10-degree distal femoral cut set at external rotation to 5 degrees. Once we made this cut, we then brought the knee down to extension and placed the extension block in the knee and I was pleased with her having full extension.  We then went back to the femur and set our sizing guide off the epicondylar axis and sized for a size 4 femur.  We then made our anterior and posterior cuts and a chamfer cuts of this 4 and then made our femoral box cut and drilled lugs for this.  We then went back to the tibia and trialed for size 4 tibia and I liked the coverage of the size 5.  We drilled the keel for this and then with a 4  trial femur in place, 9 mm polyethylene insert for a 5 tibia, we put the knee through range of motion with the trial compartments in place and I was very pleased with the stability and range of motion.  We then cut our patella and drilled lugs for size 29 patellar button.  We then removed all trial components and copiously irrigated the knee with normal saline solution using pulsatile lavage.  We then cemented the real Stryker triathlon size 5 tibia followed by the real size 4 femur.  We placed the real 9 mm polyethylene insert with size 5 tibia, cemented the 29 mm patellar button.  Once the cement had dried, we let the tourniquet down. Hemostasis was obtained with electrocautery.  We placed a medium Hemovac in the knee joint and copiously irrigated the knee with normal saline solution using pulsatile lavage.  We then closed the arthrotomy with interrupted #1 Vicryl suture followed by 0-Vicryl in the deep tissue, 2- 0 Vicryl in the subcutaneous tissue, 4-0 Monocryl subcuticular stitch, Dermabond, and well-padded sterile dressing.  Knee immobilizer was placed as well.  She was awakened, extubated, and taken to the recovery room in stable condition.  All final counts were correct.  There were no complications noted.  Of note, Kriste Basque, PA-C was present during the entire case and his presence was crucial for retracting for patient positioning for helping position the implants and for closure of the wound.     Vanita Panda. Magnus Ivan, M.D.     CYB/MEDQ  D:  04/05/2013  T:  04/06/2013  Job:  [id number]

## 2013-04-07 LAB — CBC
HCT: 27.5 % — ABNORMAL LOW (ref 36.0–46.0)
Hemoglobin: 9.2 g/dL — ABNORMAL LOW (ref 12.0–15.0)
MCV: 91.7 fL (ref 78.0–100.0)
RBC: 3 MIL/uL — ABNORMAL LOW (ref 3.87–5.11)
RDW: 14.1 % (ref 11.5–15.5)
WBC: 11.3 10*3/uL — ABNORMAL HIGH (ref 4.0–10.5)

## 2013-04-07 MED ORDER — ASPIRIN EC 325 MG PO TBEC: 325.0000 mg | DELAYED_RELEASE_TABLET | Freq: Two times a day (BID) | ORAL | Status: DC

## 2013-04-07 MED ORDER — OXYCODONE-ACETAMINOPHEN 5-325 MG PO TABS: 1.0000 | ORAL_TABLET | ORAL | Status: DC | PRN

## 2013-04-07 NOTE — Care Management Note (Signed)
CARE MANAGEMENT NOTE 04/07/2013  Patient:  Ann Rangel, PAZ   Account Number:  [id number]  Date Initiated:  04/07/2013  Documentation initiated by:  Vance Peper  Subjective/Objective Assessment:   73 yr old female s/p right total knee arthroplasty.     Action/Plan:   Patient preoperatively setup with [facility]. Has DME, CPM to be delivered. CM folowing to see if patient will need SNF.   Anticipated DC Date:  04/08/2013   Anticipated DC Plan:           Choice offered to / List presented to:        DME agency  [employer]     HH arranged  HH-2 PT  HH-3 OT      [facility] agency  [facility]   Status of service:  In process, will continue to follow Medicare Important Message given?   (If response is "NO", the following Medicare IM given date fields will be blank) Date Medicare IM given:   Date Additional Medicare IM given:    Discharge Disposition:    Per UR Regulation:    If discussed at Long Length of Stay Meetings, dates discussed:    Comments:

## 2013-04-07 NOTE — Progress Notes (Signed)
Physical Therapy Treatment Patient Details Name: Ann Rangel MRN: [id number] DOB: 1941-09-20 Today's Date: 04/07/2013 Time: 6578-4696 PT Time Calculation (min): 26 min  PT Assessment / Plan / Recommendation Comments on Treatment Session  Patient making alot better progress this afternoon than this morning. Stated she had backed off some of the pain meds and is not longer feeling dizzy. If patient continues to progress she should be able to DC home when ready. Continue with current POC    Follow Up Recommendations  Home health PT;Supervision/Assistance - 24 hour     Does the patient have the potential to tolerate intense rehabilitation     Barriers to Discharge        Equipment Recommendations  Rolling walker with 5" wheels;Other (comment)    Recommendations for Other Services    Frequency 7X/week   Plan Discharge plan remains appropriate;Frequency remains appropriate    Precautions / Restrictions Precautions Precautions: Fall;Knee Precaution Comments: Reviewed precautions Required Braces or Orthoses: Knee Immobilizer - Right Knee Immobilizer - Right: Other (comment) (MD has not specified) Restrictions Weight Bearing Restrictions: Yes RLE Weight Bearing: Weight bearing as tolerated   Pertinent Vitals/Pain     Mobility  Bed Mobility Bed Mobility: Not assessed Supine to Sit: 4: Min guard;With rails Sitting - Scoot to Edge of Bed: 4: Min guard Details for Bed Mobility Assistance: increased time but patient completing without physical assist Transfers Transfers: Sit to Stand;Stand to Sit Sit to Stand: 4: Min assist;With upper extremity assist;From chair/3-in-1 Stand to Sit: 4: Min assist;With upper extremity assist;To chair/3-in-1 Details for Transfer Assistance: A to intiate stand and Min A and cues to control decent to recliner chair. Cues for hand placement and safety with walker Ambulation/Gait Ambulation/Gait Assistance: 4: Min guard Ambulation Distance (Feet): 40  Feet Assistive device: Rolling walker Ambulation/Gait Assistance Details: CUes for upright posture. One seated rest break. Patient with better quality of gait this session Gait Pattern: Step-to pattern;Decreased stance time - right;Decreased step length - left Gait velocity: decreased    Exercises Total Joint Exercises Quad Sets: AROM;Right;10 reps Heel Slides: AAROM;Right;10 reps;Supine Hip ABduction/ADduction: AAROM;Right;Supine;10 reps Long Arc Quad: AAROM;Right;10 reps;Supine Knee Flexion: AAROM;Right;10 reps;Supine   PT Diagnosis:    PT Problem List:   PT Treatment Interventions:     PT Goals Acute Rehab PT Goals PT Goal: Supine/Side to Sit - Progress: Progressing toward goal PT Goal: Stand to Sit - Progress: Progressing toward goal PT Goal: Ambulate - Progress: Progressing toward goal PT Goal: Perform Home Exercise Program - Progress: Progressing toward goal  Visit Information  Last PT Received On: 04/07/13 Assistance Needed: +1    Subjective Data      Cognition  Cognition Arousal/Alertness: Awake/alert Behavior During Therapy: WFL for tasks assessed/performed Overall Cognitive Status: Within Functional Limits for tasks assessed    Balance     End of Session PT - End of Session Equipment Utilized During Treatment: Gait belt Activity Tolerance: Patient tolerated treatment well Patient left: in chair;with call bell/phone within reach;with family/visitor present Nurse Communication: Mobility status   GP     Fredrich Birks 04/07/2013, 2:42 PM  04/07/2013 Fredrich Birks PTA [phone] pager [phone] office

## 2013-04-07 NOTE — Evaluation (Signed)
Occupational Therapy Evaluation Patient Details Name: Ann Rangel MRN: [id number] DOB: [birth year] Today's Date: 04/07/2013 Time: 4098-1191 OT Time Calculation (min): 14 min  OT Assessment / Plan / Recommendation Clinical Impression    Pt is a 72 y.o. female s/p R TKA. Pt presents with below problem list. Will benefit from skilled OT to maximize independence prior to d/c.     OT Assessment  Patient needs continued OT Services    Follow Up Recommendations  Home health OT;Supervision/Assistance - 24 hour    Barriers to Discharge      Equipment Recommendations  None recommended by OT    Recommendations for Other Services    Frequency  Min 2X/week    Precautions / Restrictions Precautions Precautions: Fall;Knee Precaution Comments: Reviewed precautions Required Braces or Orthoses: Knee Immobilizer - Right Knee Immobilizer - Right: Other (comment) (MD has not specified) Restrictions Weight Bearing Restrictions: Yes RLE Weight Bearing: Weight bearing as tolerated   Pertinent Vitals/Pain Pain 5/10 in knee. Repositioned.     ADL  Eating/Feeding: Independent Where Assessed - Eating/Feeding: Chair Grooming: Performed;Wash/dry hands;Min guard Where Assessed - Grooming: Unsupported standing Upper Body Bathing: Set up Where Assessed - Upper Body Bathing: Supported sitting Lower Body Bathing: Moderate assistance Where Assessed - Lower Body Bathing: Supported sit to stand Upper Body Dressing: Set up Where Assessed - Upper Body Dressing: Supported sitting Lower Body Dressing: Moderate assistance Where Assessed - Lower Body Dressing: Supported sit to Pharmacist, hospital: Performed;Minimal Dentist Method: Sit to Barista: Raised toilet seat with arms (or 3-in-1 over toilet) Toileting - Clothing Manipulation and Hygiene: Min guard Where Assessed - Toileting Clothing Manipulation and Hygiene: Sit to stand from 3-in-1 or toilet Tub/Shower  Transfer Method: Not assessed Equipment Used: Gait belt;Knee Immobilizer;Rolling walker Transfers/Ambulation Related to ADLs: Minguard for ambulation and Min A for transfers ADL Comments: Pt unable to reach right foot to don/doff sock. Pt overall Mod A level for LB ADLs. Pt performed toileting and also grooming at sink. Pt requiring Min A for transfers. Pt interested in AE. Will review next session.    OT Diagnosis: Acute pain  OT Problem List: Decreased strength;Decreased range of motion;Decreased activity tolerance;Impaired balance (sitting and/or standing);Decreased safety awareness;Decreased knowledge of use of DME or AE;Decreased knowledge of precautions;Pain OT Treatment Interventions: Self-care/ADL training;DME and/or AE instruction;Therapeutic activities;Patient/family education;Balance training   OT Goals Acute Rehab OT Goals OT Goal Formulation: With patient Time For Goal Achievement: 04/14/13 Potential to Achieve Goals: Good ADL Goals Pt Will Perform Lower Body Bathing: with modified independence;Sit to stand from chair ADL Goal: Lower Body Bathing - Progress: Goal set today Pt Will Perform Lower Body Dressing: with modified independence;Sit to stand from chair;Sit to stand from bed ADL Goal: Lower Body Dressing - Progress: Goal set today Pt Will Transfer to Toilet: with modified independence;Ambulation;with DME ADL Goal: Toilet Transfer - Progress: Goal set today Pt Will Perform Toileting - Clothing Manipulation: with modified independence;Standing ADL Goal: Toileting - Clothing Manipulation - Progress: Goal set today Pt Will Perform Toileting - Hygiene: with modified independence;Sit to stand from 3-in-1/toilet;Sitting on 3-in-1 or toilet ADL Goal: Toileting - Hygiene - Progress: Goal set today Pt Will Perform Tub/Shower Transfer: Tub transfer;with supervision;Ambulation;with DME ADL Goal: Tub/Shower Transfer - Progress: Goal set today  Visit Information  Last OT Received  On: 04/07/13 Assistance Needed: +1    Subjective Data      Prior Functioning     Home Living Lives With: Alone Available Help  at Discharge: Family;Available 24 hours/day Type of Home: House Home Access: Stairs to enter [employer] of Steps: 4 Entrance Stairs-Rails: Can reach both Home Layout: One level Bathroom Shower/Tub: Forensic scientist: Standard Bathroom Accessibility: Yes How Accessible: Accessible via walker Home Adaptive Equipment: None Prior Function Level of Independence: Independent Able to Take Stairs?: Yes Driving: Yes Vocation: Full time employment Communication Communication: No difficulties Dominant Hand: Right         Vision/Perception     Cognition  Cognition Arousal/Alertness: Awake/alert Behavior During Therapy: WFL for tasks assessed/performed Overall Cognitive Status: Within Functional Limits for tasks assessed    Extremity/Trunk Assessment Right Upper Extremity Assessment RUE ROM/Strength/Tone: WFL for tasks assessed Left Upper Extremity Assessment LUE ROM/Strength/Tone: WFL for tasks assessed     Mobility Bed Mobility Bed Mobility: Not assessed Supine to Sit: 4: Min guard;With rails Sitting - Scoot to Edge of Bed: 4: Min guard Details for Bed Mobility Assistance: increased time but patient completing without physical assist Transfers Transfers: Sit to Stand;Stand to Sit Sit to Stand: 4: Min assist;With upper extremity assist;From chair/3-in-1 Stand to Sit: 4: Min assist;With upper extremity assist;To chair/3-in-1 Details for Transfer Assistance: A to intiate stand and Min A and cues to control decent to recliner chair. Cues for hand placement and safety with walker        Balance     End of Session OT - End of Session Equipment Utilized During Treatment: Gait belt;Right knee immobilizer Activity Tolerance: Patient tolerated treatment well Patient left: in chair;with call bell/phone within  reach;with family/visitor present Nurse Communication: Mobility status  GO     Earlie Raveling OTR/L [phone] 04/07/2013, 3:56 PM

## 2013-04-07 NOTE — Progress Notes (Signed)
Subjective: 2 Days Post-Op Procedure(s) (LRB): RIGHT TOTAL KNEE ARTHROPLASTY (Right) Patient reports pain as moderate.  Some increased pain with PT this AM, otherwise doing well.  Objective: Vital signs in last 24 hours: Temp:  [98 F (36.7 C)-100.3 F (37.9 C)] 100.3 F (37.9 C) (06/12 0607) Pulse Rate:  [92-111] 111 (06/12 0607) Resp:  [18-20] 18 (06/12 0607) BP: (99-123)/(47-52) 121/52 mmHg (06/12 0607) SpO2:  [93 %-97 %] 93 % (06/12 0607)  Intake/Output from previous day: 06/11 0701 - 06/12 0700 In: 120 [P.O.:120] Out: -  Intake/Output this shift:     Recent Labs  04/06/13 0410 04/07/13 0440  HGB 10.1* 9.2*    Recent Labs  04/06/13 0410 04/07/13 0440  WBC 8.5 11.3*  RBC 3.29* 3.00*  HCT 30.3* 27.5*  PLT 185 184    Recent Labs  04/06/13 0410  NA 136  K 3.7  CL 101  CO2 25  BUN 12  CREATININE 0.83  GLUCOSE 106*  CALCIUM 8.2*    Recent Labs  04/05/13 1125  INR 1.05    Neurologically intact Intact pulses distally Dorsiflexion/Plantar flexion intact Incision: scant drainage Compartment soft  Assessment/Plan: 2 Days Post-Op Procedure(s) (LRB): RIGHT TOTAL KNEE ARTHROPLASTY (Right) Up with therapy Monitor HGB and signs / symptoms of Acute blood loss anemia on PO iron Encouraged incentive spirometry Possible D/C home tomorrow  Richardean Canal 04/07/2013, 9:40 AM

## 2013-04-07 NOTE — Progress Notes (Signed)
Physical Therapy Treatment Patient Details Name: FRANCES AMBROSINO MRN: [id number] DOB: 1941-10-04 Today's Date: 04/07/2013 Time: 7829-5621 PT Time Calculation (min): 28 min  PT Assessment / Plan / Recommendation Comments on Treatment Session  Patient not making progress this morning with mobility. Discussed with the patient and her daughter that if she did not progress during her second session she will more than likely require ST SNF for rehab prior to DC home    Follow Up Recommendations  Home health PT;Supervision/Assistance - 24 hour     Does the patient have the potential to tolerate intense rehabilitation     Barriers to Discharge        Equipment Recommendations  Rolling walker with 5" wheels;Other (comment)    Recommendations for Other Services    Frequency 7X/week   Plan Discharge plan remains appropriate;Frequency remains appropriate    Precautions / Restrictions Precautions Precautions: Fall;Knee Required Braces or Orthoses: Knee Immobilizer - Right Restrictions Weight Bearing Restrictions: Yes RLE Weight Bearing: Weight bearing as tolerated   Pertinent Vitals/Pain 8/10 knee pain    Mobility  Bed Mobility Supine to Sit: 4: Min assist;HOB elevated;With rails Sitting - Scoot to Edge of Bed: 4: Min assist Details for Bed Mobility Assistance: (A) to advance R LE onto bed; verbal cues for hand placement and sequencing Transfers Transfers: Sit to Stand;Stand to Sit Sit to Stand: 4: Min assist;With armrests;With upper extremity assist;From bed Stand to Sit: 4: Min assist;To elevated surface;To chair/3-in-1;With upper extremity assist Details for Transfer Assistance: Patient required A to initiate stand and cues for safe technique and hand placement. Utilized rocking technique for stand Ambulation/Gait Ambulation/Gait Assistance: 4: Min Environmental consultant (Feet): 16 Feet Assistive device: Rolling walker Ambulation/Gait Assistance Details: Cues for upright  posture and not to lean forward on RW. Cues for heel strike and to increase step length. Patietn required ~4 rest breaks with short amount of ambulation Gait Pattern: Step-to pattern;Decreased stance time - right;Decreased step length - left;Antalgic;Trunk flexed Gait velocity: decreased    Exercises Total Joint Exercises Heel Slides: AAROM;Right;10 reps;Supine Hip ABduction/ADduction: AAROM;Right;Supine;10 reps Long Arc Quad: AAROM;Right;10 reps;Supine Knee Flexion: AAROM;Right;10 reps;Supine   PT Diagnosis:    PT Problem List:   PT Treatment Interventions:     PT Goals Acute Rehab PT Goals PT Goal: Supine/Side to Sit - Progress: Progressing toward goal PT Goal: Stand to Sit - Progress: Progressing toward goal PT Goal: Ambulate - Progress: Not progressing PT Goal: Perform Home Exercise Program - Progress: Progressing toward goal  Visit Information  Last PT Received On: 04/07/13 Assistance Needed: +1    Subjective Data      Cognition  Cognition Arousal/Alertness: Awake/alert Behavior During Therapy: WFL for tasks assessed/performed Overall Cognitive Status: Within Functional Limits for tasks assessed    Balance     End of Session PT - End of Session Equipment Utilized During Treatment: Gait belt;Right knee immobilizer Activity Tolerance: Patient tolerated treatment well Patient left: in chair Nurse Communication: Mobility status CPM Right Knee CPM Right Knee: Off   GP     Fredrich Birks 04/07/2013, 11:17 AM  04/07/2013 Fredrich Birks PTA [phone] pager [phone] office

## 2013-04-08 LAB — CBC
HCT: 21 % — ABNORMAL LOW (ref 36.0–46.0)
Hemoglobin: 7.3 g/dL — ABNORMAL LOW (ref 12.0–15.0)
MCH: 31.5 pg (ref 26.0–34.0)
MCHC: 34.8 g/dL (ref 30.0–36.0)
RBC: 2.32 MIL/uL — ABNORMAL LOW (ref 3.87–5.11)

## 2013-04-08 LAB — PREPARE RBC (CROSSMATCH)

## 2013-04-08 MED ORDER — FUROSEMIDE 10 MG/ML IJ SOLN
20.0000 mg | Freq: Once | INTRAMUSCULAR | Status: AC
  Administered 2013-04-08: 20 mg via INTRAVENOUS
  Filled 2013-04-08: qty 2

## 2013-04-08 NOTE — Progress Notes (Signed)
Pt being discharged to home, daughter is driving her. All discharge paperwork gone over, pt verbalizes understanding. No questions at this time.    Delynn Flavin, RN, BSN

## 2013-04-08 NOTE — Discharge Summary (Signed)
Patient ID: Ann Rangel MRN: [id number] DOB/AGE: Jul 06, 1941 72 y.o.  Admit date: 04/05/2013 Discharge date: 04/08/2013  Admission Diagnoses:  Principal Problem:   Arthritis of knee, right   Discharge Diagnoses:  Same  Past Medical History  Diagnosis Date  . Arthritis   . Anemia     Surgeries: Procedure(s): RIGHT TOTAL KNEE ARTHROPLASTY on 04/05/2013   Consultants:    Discharged Condition: Improved  Hospital Course: Ann Rangel is an 72 y.o. female who was admitted 04/05/2013 for operative treatment ofArthritis of knee, right. Patient has severe unremitting pain that affects sleep, daily activities, and work/hobbies. After pre-op clearance the patient was taken to the operating room on 04/05/2013 and underwent  Procedure(s): RIGHT TOTAL KNEE ARTHROPLASTY.    Patient was given perioperative antibiotics: Anti-infectives   Start     Dose/Rate Route Frequency Ordered Stop   04/05/13 1900  clindamycin (CLEOCIN) IVPB 600 mg     600 mg 100 mL/hr over 30 Minutes Intravenous Every 6 hours 04/05/13 1653 04/06/13 0300   04/05/13 0600  clindamycin (CLEOCIN) IVPB 900 mg     900 mg 100 mL/hr over 30 Minutes Intravenous On call to O.R. 04/04/13 1425 04/05/13 1253       Patient was given sequential compression devices, early ambulation, and chemoprophylaxis to prevent DVT.  Patient benefited maximally from hospital stay and there were no complications.  She did receive a transfusion secondary to symptomatic acute blood loss anemia.  Recent vital signs: Patient Vitals for the past 24 hrs:  BP Temp Pulse Resp SpO2 Height Weight  04/08/13 0550 124/47 mmHg 100.3 F (37.9 C) 99 16 94 % - -  04/08/13 0425 - - 95 16 93 % - -  04/08/13 0036 - - 93 16 93 % - -  04/07/13 2014 - - 97 18 93 % 5' 8.5" (1.74 m) 101.7 kg (224 lb 3.3 oz)  04/07/13 2006 128/55 mmHg 99.5 F (37.5 C) 99 18 92 % - -     Recent laboratory studies:  Recent Labs  04/05/13 1125  04/06/13 0410 04/07/13 0440  04/08/13 0435  WBC  --   < > 8.5 11.3* 9.5  HGB  --   < > 10.1* 9.2* 7.3*  HCT  --   < > 30.3* 27.5* 21.0*  PLT  --   < > 185 184 151  NA  --   --  136  --   --   K  --   --  3.7  --   --   CL  --   --  101  --   --   CO2  --   --  25  --   --   BUN  --   --  12  --   --   CREATININE  --   --  0.83  --   --   GLUCOSE  --   --  106*  --   --   INR 1.05  --   --   --   --   CALCIUM  --   --  8.2*  --   --   < > = values in this interval not displayed.   Discharge Medications:     Medication List    STOP taking these medications       diclofenac sodium 1 % Gel  Commonly known as:  VOLTAREN     HYDROcodone-acetaminophen 5-325 MG per tablet  Commonly known as:  NORCO/VICODIN  ibuprofen 200 MG tablet  Commonly known as:  ADVIL,MOTRIN     meloxicam 7.5 MG tablet  Commonly known as:  MOBIC      TAKE these medications       aspirin EC 325 MG tablet  Take 1 tablet (325 mg total) by mouth 2 (two) times daily after a meal.     CALTRATE 600+D PO  Take 1 tablet by mouth daily.     ferrous sulfate 325 (65 FE) MG tablet  Take 325 mg by mouth every other day.     GERITOL PO  Take 1 tablet by mouth daily.     oxyCODONE-acetaminophen 5-325 MG per tablet  Commonly known as:  ROXICET  Take 1-2 tablets by mouth every 4 (four) hours as needed for pain.     SUPER B COMPLEX PO  Take 1 tablet by mouth daily.     vitamin B-12 1000 MCG tablet  Commonly known as:  CYANOCOBALAMIN  Take 1,000 mcg by mouth daily.     VITAMIN D-3 PO  Take 1 tablet by mouth daily.     VITAMIN E PO  Take 1 tablet by mouth daily.        Diagnostic Studies: Dg Knee Right Port  04/05/2013   *RADIOLOGY REPORT*  Clinical Data: Postop total knee replacement  PORTABLE RIGHT KNEE - 1-2 VIEW  Comparison: None.  Findings: Frontal and lateral views of the knee demonstrate surgical changes consistent with total knee arthroplasty without evidence of immediate hardware complication.  A soft tissue drain is  noted within the suprapatellar recess.  Expected post surgical changes including subcutaneous emphysema and soft tissue edema. Bony mineralization appears within normal limits.  IMPRESSION: Expected surgical changes of total knee replacement without evidence of immediate complication.  A drain is noted within the suprapatellar recess.   Original Report Authenticated By: Malachy Moan, M.D.    Disposition:   To home      Discharge Orders   Future Orders Complete By Expires     Call MD / Call 911  As directed     Comments:      If you experience chest pain or shortness of breath, CALL 911 and be transported to the hospital emergency room.  If you develope a fever above 101 F, pus (white drainage) or increased drainage or redness at the wound, or calf pain, call your surgeon's office.    Call MD / Call 911  As directed     Comments:      If you experience chest pain or shortness of breath, CALL 911 and be transported to the hospital emergency room.  If you develope a fever above 101 F, pus (white drainage) or increased drainage or redness at the wound, or calf pain, call your surgeon's office.    Constipation Prevention  As directed     Comments:      Drink plenty of fluids.  Prune juice may be helpful.  You may use a stool softener, such as Colace (over the counter) 100 mg twice a day.  Use MiraLax (over the counter) for constipation as needed.    Constipation Prevention  As directed     Comments:      Drink plenty of fluids.  Prune juice may be helpful.  You may use a stool softener, such as Colace (over the counter) 100 mg twice a day.  Use MiraLax (over the counter) for constipation as needed.    Diet - low sodium heart healthy  As directed     Diet - low sodium heart healthy  As directed     Discharge instructions  As directed     Comments:      Increase your activities as comfort allows. Wait until 04/11/13 to get your incision wet in the shower, then dry dressing daily    Discharge  patient  As directed     Increase activity slowly as tolerated  As directed     Increase activity slowly as tolerated  As directed        Follow-up Information   Follow up with Ann Hitch, MD In 2 weeks.   Contact information:   [address] [place] [address] [phone]        Signed: Kathryne Rangel 04/08/2013, 6:50 AM

## 2013-04-08 NOTE — Progress Notes (Signed)
Nightshift RN started first unit of PRBCs in left wrist PIV. Upon re-check, wrist noted to be swollen and pt reported pain to dayshift RN. Blood unit stopped, new PIV attained and blood re-started at slow rate again. Vitals re-checked at 15 minutes post re-start. Pt denies any pain, SOB, itchiness or HA. Will monitor.    Delynn Flavin, RN, BSN

## 2013-04-08 NOTE — Progress Notes (Signed)
Pt received 2u PRBCs for Hgb 7.3. No s/s complications. MD Magnus Ivan) did not want to recheck Hgb after units transfused. Pt reports dizziness has improved when standing up.   Delynn Flavin, RN, BSN

## 2013-04-08 NOTE — Progress Notes (Signed)
Physical Therapy Treatment Patient Details Name: HAILEYANN STAIGER MRN: [id number] DOB: [birth year] Today's Date: 04/08/2013 Time: 4098-1191 PT Time Calculation (min): 27 min  PT Assessment / Plan / Recommendation Comments on Treatment Session  Stair training completed. Anticipate DC today    Follow Up Recommendations  Home health PT;Supervision/Assistance - 24 hour     Does the patient have the potential to tolerate intense rehabilitation     Barriers to Discharge        Equipment Recommendations  Rolling walker with 5" wheels;Other (comment)    Recommendations for Other Services    Frequency 7X/week   Plan Discharge plan remains appropriate;Frequency remains appropriate    Precautions / Restrictions Precautions Precautions: Fall;Knee Precaution Comments: Reviewed precautions Required Braces or Orthoses: Knee Immobilizer - Right Restrictions RLE Weight Bearing: Weight bearing as tolerated   Pertinent Vitals/Pain     Mobility  Bed Mobility Supine to Sit: 6: Modified independent (Device/Increase time) Sitting - Scoot to Edge of Bed: 6: Modified independent (Device/Increase time) Transfers Sit to Stand: 5: Supervision;With upper extremity assist;With armrests;From chair/3-in-1 Stand to Sit: 5: Supervision;With upper extremity assist;With armrests;To chair/3-in-1 Details for Transfer Assistance: Patient with good technique. Supervision for safety Ambulation/Gait Ambulation/Gait Assistance: 5: Supervision Ambulation Distance (Feet): 100 Feet Assistive device: Rolling walker Ambulation/Gait Assistance Details: Cues for posture and to stay closer to RW. Limited ambulation due to patient receiving blood Gait Pattern: Step-to pattern;Decreased stance time - right;Decreased step length - left Gait velocity: decreased Stairs: Yes Stairs Assistance: 4: Min assist Stairs Assistance Details (indicate cue type and reason): A for RW. Cues for technique. Daughter educated on stairs as  well Stair Management Technique: No rails;With walker;Step to pattern Number of Stairs: 2    Exercises Total Joint Exercises Quad Sets: AROM;Right;10 reps Heel Slides: AAROM;Right;10 reps;Supine Hip ABduction/ADduction: AAROM;Right;Supine;10 reps Long Arc Quad: AAROM;Right;10 reps;Supine Knee Flexion: AAROM;Right;10 reps;Supine   PT Diagnosis:    PT Problem List:   PT Treatment Interventions:     PT Goals Acute Rehab PT Goals PT Goal: Supine/Side to Sit - Progress: Progressing toward goal PT Goal: Stand to Sit - Progress: Progressing toward goal PT Goal: Ambulate - Progress: Progressing toward goal PT Goal: Up/Down Stairs - Progress: Progressing toward goal PT Goal: Perform Home Exercise Program - Progress: Progressing toward goal  Visit Information  Last PT Received On: 04/08/13 Assistance Needed: +1    Subjective Data      Cognition  Cognition Arousal/Alertness: Awake/alert Behavior During Therapy: WFL for tasks assessed/performed Overall Cognitive Status: Within Functional Limits for tasks assessed    Balance     End of Session PT - End of Session Equipment Utilized During Treatment: Gait belt Activity Tolerance: Patient tolerated treatment well Patient left: in chair;with call bell/phone within reach   GP     Fredrich Birks 04/08/2013, 1:37 PM  04/08/2013 Fredrich Birks PTA [phone] pager [phone] office

## 2013-04-08 NOTE — Progress Notes (Signed)
Physical Therapy Treatment Patient Details Name: Ann Rangel MRN: [id number] DOB: 1941-09-10 Today's Date: 04/08/2013 Time: 4098-1191 PT Time Calculation (min): 27 min  PT Assessment / Plan / Recommendation Comments on Treatment Session  Patient making good progress with mobility however limited by Hgb and recieiving blood. Will work on steps this afternoon. MD stated home today. Will see how patient progresses this afternoon    Follow Up Recommendations  Home health PT;Supervision/Assistance - 24 hour     Does the patient have the potential to tolerate intense rehabilitation     Barriers to Discharge        Equipment Recommendations  Rolling walker with 5" wheels;Other (comment)    Recommendations for Other Services    Frequency 7X/week   Plan Discharge plan remains appropriate;Frequency remains appropriate    Precautions / Restrictions Precautions Precautions: Fall;Knee Precaution Comments: Reviewed precautions Required Braces or Orthoses: Knee Immobilizer - Right Restrictions Weight Bearing Restrictions: No RLE Weight Bearing: Weight bearing as tolerated   Pertinent Vitals/Pain     Mobility  Bed Mobility Supine to Sit: 6: Modified independent (Device/Increase time) Sitting - Scoot to Edge of Bed: 6: Modified independent (Device/Increase time) Transfers Sit to Stand: 5: Supervision;With upper extremity assist;With armrests;From chair/3-in-1 Stand to Sit: 5: Supervision;With upper extremity assist;With armrests;To chair/3-in-1;To bed Details for Transfer Assistance: Patient with good technique. Supervision for safety Ambulation/Gait Ambulation/Gait Assistance: 5: Supervision Ambulation Distance (Feet): 40 Feet Assistive device: Rolling walker Ambulation/Gait Assistance Details: Cues for posture and to stay closer to RW. Limited ambulation due to patient receiving blood Gait Pattern: Step-to pattern;Decreased stance time - right;Decreased step length - left Gait  velocity: decreased    Exercises Total Joint Exercises Quad Sets: AROM;Right;10 reps Heel Slides: AAROM;Right;10 reps;Supine Hip ABduction/ADduction: AAROM;Right;Supine;10 reps Long Arc Quad: AAROM;Right;10 reps;Supine Knee Flexion: AAROM;Right;10 reps;Supine   PT Diagnosis:    PT Problem List:   PT Treatment Interventions:     PT Goals Acute Rehab PT Goals PT Goal: Supine/Side to Sit - Progress: Met PT Goal: Stand to Sit - Progress: Progressing toward goal PT Goal: Ambulate - Progress: Progressing toward goal PT Goal: Perform Home Exercise Program - Progress: Progressing toward goal  Visit Information  Last PT Received On: 04/08/13 Assistance Needed: +1    Subjective Data      Cognition  Cognition Arousal/Alertness: Awake/alert Behavior During Therapy: WFL for tasks assessed/performed Overall Cognitive Status: Within Functional Limits for tasks assessed    Balance     End of Session PT - End of Session Equipment Utilized During Treatment: Gait belt Activity Tolerance: Patient tolerated treatment well Patient left: in chair;with call bell/phone within reach;with family/visitor present CPM Right Knee CPM Right Knee: Off   GP     Fredrich Birks 04/08/2013, 11:42 AM  04/08/2013 Fredrich Birks PTA [phone] pager [phone] office

## 2013-04-08 NOTE — Progress Notes (Signed)
Occupational Therapy Treatment Patient Details Name: Ann Rangel MRN: [id number] DOB: 06/25/1941 Today's Date: 04/08/2013 Time: 4098-1191 OT Time Calculation (min): 23 min  OT Assessment / Plan / Recommendation Comments on Treatment Session Pt progressing towards goals. Pt practiced tub transfer and LB dressing.     Follow Up Recommendations  Home health OT;Supervision/Assistance - 24 hour    Barriers to Discharge       Equipment Recommendations  None recommended by OT    Recommendations for Other Services    Frequency Min 2X/week   Plan Discharge plan remains appropriate    Precautions / Restrictions Precautions Precautions: Fall;Knee Precaution Comments: Reviewed precautions Required Braces or Orthoses: Knee Immobilizer - Right Knee Immobilizer - Right: Other (comment) (MD has not specified) Restrictions Weight Bearing Restrictions: Yes RLE Weight Bearing: Weight bearing as tolerated   Pertinent Vitals/Pain Pain 6/10. Repositioned.    ADL  Lower Body Dressing: Minimal assistance Where Assessed - Lower Body Dressing: Supported sit to stand Toilet Transfer: Hydrographic surveyor Method: Sit to Barista: Raised toilet seat with arms (or 3-in-1 over toilet) Tub/Shower Transfer: Insurance risk surveyor Method: Science writer: Other (comment) (3 in 1) Equipment Used: Gait belt;Rolling walker;Sock aid;Reacher;Long-handled sponge;Long-handled shoe horn Transfers/Ambulation Related to ADLs: Minguard ADL Comments: Pt practiced tub transfer by backing to tub and sitting on 3 in 1 at minguard level. Educated to have someone with her.  Cues for hand placement. Pt practiced with AE as she verbalized yesterday she would like to do so. Pt donned underwear with sit to stand transfer with minguard assist. Pt also practiced with sockaid on Rt foot. Pt required Min A to don sock on Left foot without AE (pt reports she doesn't wear  socks much- so pt probably not purchasing sockaid) OT educated on dressing technique.     OT Diagnosis:    OT Problem List:   OT Treatment Interventions:     OT Goals Acute Rehab OT Goals OT Goal Formulation: With patient Time For Goal Achievement: 04/14/13 Potential to Achieve Goals: Good ADL Goals Pt Will Perform Lower Body Bathing: with modified independence;Sit to stand from chair Pt Will Perform Lower Body Dressing: with modified independence;Sit to stand from chair;Sit to stand from bed ADL Goal: Lower Body Dressing - Progress: Progressing toward goals Pt Will Transfer to Toilet: with modified independence;Ambulation;with DME ADL Goal: Toilet Transfer - Progress: Progressing toward goals Pt Will Perform Toileting - Clothing Manipulation: with modified independence;Standing Pt Will Perform Toileting - Hygiene: with modified independence;Sit to stand from 3-in-1/toilet;Sitting on 3-in-1 or toilet Pt Will Perform Tub/Shower Transfer: Tub transfer;with supervision;Ambulation;with DME ADL Goal: Tub/Shower Transfer - Progress: Progressing toward goals  Visit Information  Last OT Received On: 04/08/13 Assistance Needed: +1    Subjective Data      Prior Functioning       Cognition  Cognition Arousal/Alertness: Awake/alert Behavior During Therapy: WFL for tasks assessed/performed Overall Cognitive Status: Within Functional Limits for tasks assessed    Mobility  Bed Mobility Bed Mobility: Not assessed Supine to Sit: 6: Modified independent (Device/Increase time) Transfers Transfers: Sit to Stand;Stand to Sit Sit to Stand: 4: Min guard;With upper extremity assist;From chair/3-in-1 Stand to Sit: 4: Min guard;With upper extremity assist;To chair/3-in-1 Details for Transfer Assistance: Cues for hand placement. Pt taking increased time.  Cues to bring walker with her when sitting    Exercises      Balance     End of Session OT - End  of Session Equipment Utilized During  Treatment: Gait belt Activity Tolerance: Patient tolerated treatment well Patient left: in chair;with call bell/phone within reach;with family/visitor present Nurse Communication: Mobility status  GO     Earlie Raveling OTR/L [phone] 04/08/2013, 3:45 PM

## 2013-04-08 NOTE — Progress Notes (Signed)
Subjective: 3 Days Post-Op Procedure(s) (LRB): RIGHT TOTAL KNEE ARTHROPLASTY (Right) Patient reports pain as mild.  Hgb low.  Acute blood loss anemia.  Objective: Vital signs in last 24 hours: Temp:  [99.5 F (37.5 C)-100.3 F (37.9 C)] 100.3 F (37.9 C) (06/13 0550) Pulse Rate:  [93-99] 99 (06/13 0550) Resp:  [16-18] 16 (06/13 0550) BP: (124-128)/(47-55) 124/47 mmHg (06/13 0550) SpO2:  [92 %-94 %] 94 % (06/13 0550) Weight:  [101.7 kg (224 lb 3.3 oz)] 101.7 kg (224 lb 3.3 oz) (06/12 2014)  Intake/Output from previous day: 06/12 0701 - 06/13 0700 In: 360 [P.O.:360] Out: -  Intake/Output this shift: Total I/O In: 360 [P.O.:360] Out: -    Recent Labs  04/06/13 0410 04/07/13 0440 04/08/13 0435  HGB 10.1* 9.2* 7.3*    Recent Labs  04/07/13 0440 04/08/13 0435  WBC 11.3* 9.5  RBC 3.00* 2.32*  HCT 27.5* 21.0*  PLT 184 151    Recent Labs  04/06/13 0410  NA 136  K 3.7  CL 101  CO2 25  BUN 12  CREATININE 0.83  GLUCOSE 106*  CALCIUM 8.2*    Recent Labs  04/05/13 1125  INR 1.05    Sensation intact distally Intact pulses distally Dorsiflexion/Plantar flexion intact Incision: no drainage No cellulitis present Compartment soft  Assessment/Plan: 3 Days Post-Op Procedure(s) (LRB): RIGHT TOTAL KNEE ARTHROPLASTY (Right) Discharge home with home health Transfuse one unit of blood prior to discharge.  Kathryne Hitch 04/08/2013, 6:46 AM

## 2013-04-08 NOTE — Care Management Note (Signed)
CARE MANAGEMENT NOTE 04/08/2013  Patient:  Ann Rangel, Ann Rangel   Account Number:  [id number]  Date Initiated:  04/07/2013  Documentation initiated by:  Vance Peper  Subjective/Objective Assessment:   72 yr old female s/p right total knee arthroplasty.     Action/Plan:   Patient preoperatively setup with [facility]. Has DME, CPM to be delivered. CM folowing to see if patient will need SNF.  Pastient will go home, receiving blood transfusion first.   Anticipated DC Date:  04/08/2013   Anticipated DC Plan:  HOME W HOME HEALTH SERVICES      DC Planning Services  CM consult      Choice offered to / List presented to:        DME agency  [employer]     HH arranged  HH-2 PT  HH-3 OT      [facility] agency  [facility]   Status of service:  Completed, signed off Medicare Important Message given?   (If response is "NO", the following Medicare IM given date fields will be blank) Date Medicare IM given:   Date Additional Medicare IM given:    Discharge Disposition:  HOME W HOME HEALTH SERVICES  Per UR Regulation:    If discussed at Long Length of Stay Meetings, dates discussed:    Comments:

## 2013-04-09 LAB — TYPE AND SCREEN

## 2013-04-12 ENCOUNTER — Encounter (HOSPITAL_COMMUNITY): Payer: Self-pay | Admitting: Orthopaedic Surgery

## 2014-03-10 ENCOUNTER — Other Ambulatory Visit (HOSPITAL_COMMUNITY): Payer: Self-pay | Admitting: Orthopaedic Surgery

## 2014-03-28 ENCOUNTER — Encounter (HOSPITAL_COMMUNITY): Payer: Self-pay | Admitting: Pharmacy Technician

## 2014-03-28 NOTE — Pre-Procedure Instructions (Signed)
ABISAI COBLE  03/28/2014   Your procedure is scheduled on:  Tues, June 9 @ 3:05 PM  Report to [facility]  at 1:00 PM.  Call this number if you have problems the morning of surgery: [phone]   Remember:   Do not eat food or drink liquids after midnight.   Take these medicines the morning of surgery with A SIP OF WATER: Pain Pill(if needed)               Stop taking your Aspirin,Ibuprofen,Meloxicam,and Vit E. No Goody's,BC's,Aleve,Fish Oil,or any Herbal Medications   Do not wear jewelry, make-up or nail polish.  Do not wear lotions, powders, or perfumes. You may wear deodorant.  Do not shave 48 hours prior to surgery.   Do not bring valuables to the hospital.  [facility] is not responsible                  for any belongings or valuables.               Contacts, dentures or bridgework may not be worn into surgery.  Leave suitcase in the car. After surgery it may be brought to your room.  For patients admitted to the hospital, discharge time is determined by your                treatment team.               Special Instructions:  [place] - Preparing for Surgery  Before surgery, you can play an important role.  Because skin is not sterile, your skin needs to be as free of germs as possible.  You can reduce the number of germs on you skin by washing with CHG (chlorahexidine gluconate) soap before surgery.  CHG is an antiseptic cleaner which kills germs and bonds with the skin to continue killing germs even after washing.  Please DO NOT use if you have an allergy to CHG or antibacterial soaps.  If your skin becomes reddened/irritated stop using the CHG and inform your nurse when you arrive at Short Stay.  Do not shave (including legs and underarms) for at least 48 hours prior to the first CHG shower.  You may shave your face.  Please follow these instructions carefully:   1.  Shower with CHG Soap the night before surgery and the                                morning  of Surgery.  2.  If you choose to wash your hair, wash your hair first as usual with your       normal shampoo.  3.  After you shampoo, rinse your hair and body thoroughly to remove the                      Shampoo.  4.  Use CHG as you would any other liquid soap.  You can apply chg directly       to the skin and wash gently with scrungie or a clean washcloth.  5.  Apply the CHG Soap to your body ONLY FROM THE NECK DOWN.        Do not use on open wounds or open sores.  Avoid contact with your eyes,       ears, mouth and genitals (private parts).  Wash genitals (private parts)  with your normal soap.  6.  Wash thoroughly, paying special attention to the area where your surgery        will be performed.  7.  Thoroughly rinse your body with warm water from the neck down.  8.  DO NOT shower/wash with your normal soap after using and rinsing off       the CHG Soap.  9.  Pat yourself dry with a clean towel.            10.  Wear clean pajamas.            11.  Place clean sheets on your bed the night of your first shower and do not        sleep with pets.  Day of Surgery  Do not apply any lotions/deoderants the morning of surgery.  Please wear clean clothes to the hospital/surgery center.     Please read over the following fact sheets that you were given: Pain Booklet, Coughing and Deep Breathing, Blood Transfusion Information, MRSA Information and Surgical Site Infection Prevention

## 2014-03-29 ENCOUNTER — Encounter (HOSPITAL_COMMUNITY)
Admission: RE | Admit: 2014-03-29 | Discharge: 2014-03-29 | Disposition: A | Discharge status: Home / Self Care | Payer: Medicare Other | Source: Ambulatory Visit | Attending: Orthopaedic Surgery | Admitting: Orthopaedic Surgery

## 2014-03-29 ENCOUNTER — Encounter (HOSPITAL_COMMUNITY): Payer: Self-pay

## 2014-03-29 DIAGNOSIS — Z01812 Encounter for preprocedural laboratory examination: Secondary | ICD-10-CM | POA: Insufficient documentation

## 2014-03-29 LAB — TYPE AND SCREEN
ABO/RH(D): A POS
Antibody Screen: NEGATIVE

## 2014-03-29 LAB — BASIC METABOLIC PANEL
BUN: 17 mg/dL (ref 6–23)
CO2: 25 meq/L (ref 19–32)
Calcium: 9.7 mg/dL (ref 8.4–10.5)
Chloride: 104 mEq/L (ref 96–112)
Creatinine, Ser: 0.85 mg/dL (ref 0.50–1.10)
GFR calc Af Amer: 77 mL/min — ABNORMAL LOW (ref >90)
GFR calc non Af Amer: 67 mL/min — ABNORMAL LOW (ref >90)
Glucose, Bld: 84 mg/dL (ref 70–99)
Potassium: 5 mEq/L (ref 3.7–5.3)
Sodium: 141 mEq/L (ref 137–147)

## 2014-03-29 LAB — URINALYSIS, ROUTINE W REFLEX MICROSCOPIC
BILIRUBIN URINE: NEGATIVE
Glucose, UA: NEGATIVE mg/dL
Hgb urine dipstick: NEGATIVE
KETONES UR: NEGATIVE mg/dL
Leukocytes, UA: NEGATIVE
NITRITE: NEGATIVE
Protein, ur: NEGATIVE mg/dL
SPECIFIC GRAVITY, URINE: 1.012 (ref 1.005–1.030)
Urobilinogen, UA: 0.2 mg/dL (ref 0.0–1.0)
pH: 7 (ref 5.0–8.0)

## 2014-03-29 LAB — CBC
HCT: 43.8 % (ref 36.0–46.0)
Hemoglobin: 14.7 g/dL (ref 12.0–15.0)
MCH: 31.3 pg (ref 26.0–34.0)
MCHC: 33.6 g/dL (ref 30.0–36.0)
MCV: 93.2 fL (ref 78.0–100.0)
PLATELETS: 228 10*3/uL (ref 150–400)
RBC: 4.7 MIL/uL (ref 3.87–5.11)
RDW: 13.9 % (ref 11.5–15.5)
WBC: 6.2 10*3/uL (ref 4.0–10.5)

## 2014-03-29 LAB — SURGICAL PCR SCREEN
MRSA, PCR: NEGATIVE
Staphylococcus aureus: NEGATIVE

## 2014-03-29 LAB — APTT: aPTT: 30 seconds (ref 24–37)

## 2014-03-29 LAB — PROTIME-INR
INR: 1.01 (ref 0.00–1.49)
Prothrombin Time: 13.1 seconds (ref 11.6–15.2)

## 2014-04-03 MED ORDER — CLINDAMYCIN PHOSPHATE 900 MG/50ML IV SOLN
900.0000 mg | INTRAVENOUS | Status: AC
  Administered 2014-04-04: 900 mg via INTRAVENOUS
  Filled 2014-04-03 (×2): qty 50

## 2014-04-04 ENCOUNTER — Inpatient Hospital Stay (HOSPITAL_COMMUNITY): Payer: Medicare Other

## 2014-04-04 ENCOUNTER — Inpatient Hospital Stay (HOSPITAL_COMMUNITY)
Admission: RE | Admit: 2014-04-04 | Discharge: 2014-04-07 | DRG: 470 | Disposition: A | Discharge status: Home Health | Payer: Medicare Other | Source: Ambulatory Visit | Attending: Orthopaedic Surgery | Admitting: Orthopaedic Surgery

## 2014-04-04 ENCOUNTER — Encounter (HOSPITAL_COMMUNITY)
Admission: RE | Disposition: A | Discharge status: Home Health | Payer: Self-pay | Source: Ambulatory Visit | Attending: Orthopaedic Surgery

## 2014-04-04 ENCOUNTER — Inpatient Hospital Stay (HOSPITAL_COMMUNITY): Payer: Medicare Other | Admitting: Anesthesiology

## 2014-04-04 ENCOUNTER — Encounter (HOSPITAL_COMMUNITY): Payer: Medicare Other | Admitting: Anesthesiology

## 2014-04-04 ENCOUNTER — Encounter (HOSPITAL_COMMUNITY): Payer: Self-pay | Admitting: Anesthesiology

## 2014-04-04 DIAGNOSIS — D62 Acute posthemorrhagic anemia: Secondary | ICD-10-CM | POA: Diagnosis not present

## 2014-04-04 DIAGNOSIS — Z7982 Long term (current) use of aspirin: Secondary | ICD-10-CM

## 2014-04-04 DIAGNOSIS — Z79899 Other long term (current) drug therapy: Secondary | ICD-10-CM

## 2014-04-04 DIAGNOSIS — Z9089 Acquired absence of other organs: Secondary | ICD-10-CM

## 2014-04-04 DIAGNOSIS — M1712 Unilateral primary osteoarthritis, left knee: Secondary | ICD-10-CM

## 2014-04-04 DIAGNOSIS — M1710 Unilateral primary osteoarthritis, unspecified knee: Principal | ICD-10-CM | POA: Diagnosis present

## 2014-04-04 DIAGNOSIS — Z880 Allergy status to penicillin: Secondary | ICD-10-CM

## 2014-04-04 DIAGNOSIS — Z96659 Presence of unspecified artificial knee joint: Secondary | ICD-10-CM

## 2014-04-04 DIAGNOSIS — Z87891 Personal history of nicotine dependence: Secondary | ICD-10-CM

## 2014-04-04 HISTORY — PX: TOTAL KNEE ARTHROPLASTY: SHX125

## 2014-04-04 HISTORY — DX: Hypothyroidism, unspecified: E03.9

## 2014-04-04 SURGERY — ARTHROPLASTY, KNEE, TOTAL
Anesthesia: General | Site: Knee | Laterality: Left

## 2014-04-04 MED ORDER — METOCLOPRAMIDE HCL 5 MG/ML IJ SOLN
10.0000 mg | Freq: Once | INTRAMUSCULAR | Status: AC
  Administered 2014-04-04: 10 mg via INTRAVENOUS

## 2014-04-04 MED ORDER — BUPIVACAINE-EPINEPHRINE (PF) 0.5% -1:200000 IJ SOLN
INTRAMUSCULAR | Status: DC | PRN
  Administered 2014-04-04: 30 mL via PERINEURAL

## 2014-04-04 MED ORDER — MENTHOL 3 MG MT LOZG: 1.0000 | LOZENGE | OROMUCOSAL | Status: DC | PRN

## 2014-04-04 MED ORDER — ONDANSETRON HCL 4 MG/2ML IJ SOLN: 4.0000 mg | Freq: Four times a day (QID) | INTRAMUSCULAR | Status: DC | PRN

## 2014-04-04 MED ORDER — METHOCARBAMOL 500 MG PO TABS
500.0000 mg | ORAL_TABLET | Freq: Four times a day (QID) | ORAL | Status: DC | PRN
  Administered 2014-04-05 – 2014-04-06 (×3): 500 mg via ORAL
  Filled 2014-04-04 (×3): qty 1

## 2014-04-04 MED ORDER — ASPIRIN EC 325 MG PO TBEC
325.0000 mg | DELAYED_RELEASE_TABLET | Freq: Two times a day (BID) | ORAL | Status: DC
  Administered 2014-04-05 – 2014-04-07 (×5): 325 mg via ORAL
  Filled 2014-04-04 (×7): qty 1

## 2014-04-04 MED ORDER — LIDOCAINE HCL (CARDIAC) 20 MG/ML IV SOLN
INTRAVENOUS | Status: AC
  Filled 2014-04-04: qty 5

## 2014-04-04 MED ORDER — GLYCOPYRROLATE 0.2 MG/ML IJ SOLN
INTRAMUSCULAR | Status: DC | PRN
  Administered 2014-04-04: .6 mg via INTRAVENOUS

## 2014-04-04 MED ORDER — MIDAZOLAM HCL 2 MG/2ML IJ SOLN
INTRAMUSCULAR | Status: AC
  Administered 2014-04-04: 1 mg via INTRAVENOUS
  Filled 2014-04-04: qty 2

## 2014-04-04 MED ORDER — PHENOL 1.4 % MT LIQD: 1.0000 | OROMUCOSAL | Status: DC | PRN

## 2014-04-04 MED ORDER — LIDOCAINE HCL (CARDIAC) 20 MG/ML IV SOLN
INTRAVENOUS | Status: DC | PRN
  Administered 2014-04-04: 75 mg via INTRAVENOUS

## 2014-04-04 MED ORDER — FENTANYL CITRATE 0.05 MG/ML IJ SOLN
INTRAMUSCULAR | Status: AC
  Filled 2014-04-04: qty 5

## 2014-04-04 MED ORDER — METOCLOPRAMIDE HCL 5 MG PO TABS
5.0000 mg | ORAL_TABLET | Freq: Three times a day (TID) | ORAL | Status: DC | PRN
  Filled 2014-04-04: qty 2

## 2014-04-04 MED ORDER — ROCURONIUM BROMIDE 100 MG/10ML IV SOLN
INTRAVENOUS | Status: DC | PRN
  Administered 2014-04-04: 50 mg via INTRAVENOUS

## 2014-04-04 MED ORDER — ACETAMINOPHEN 650 MG RE SUPP: 650.0000 mg | Freq: Four times a day (QID) | RECTAL | Status: DC | PRN

## 2014-04-04 MED ORDER — LACTATED RINGERS IV SOLN
INTRAVENOUS | Status: DC | PRN
  Administered 2014-04-04 (×2): via INTRAVENOUS

## 2014-04-04 MED ORDER — CLINDAMYCIN PHOSPHATE 600 MG/50ML IV SOLN
600.0000 mg | Freq: Four times a day (QID) | INTRAVENOUS | Status: AC
  Administered 2014-04-05 (×2): 600 mg via INTRAVENOUS
  Filled 2014-04-04 (×2): qty 50

## 2014-04-04 MED ORDER — DIPHENHYDRAMINE HCL 12.5 MG/5ML PO ELIX: 12.5000 mg | ORAL_SOLUTION | ORAL | Status: DC | PRN

## 2014-04-04 MED ORDER — VITAMIN C 500 MG PO TABS
500.0000 mg | ORAL_TABLET | Freq: Every day | ORAL | Status: DC
  Administered 2014-04-05 – 2014-04-07 (×3): 500 mg via ORAL
  Filled 2014-04-04 (×3): qty 1

## 2014-04-04 MED ORDER — METOCLOPRAMIDE HCL 5 MG/ML IJ SOLN
INTRAMUSCULAR | Status: AC
  Filled 2014-04-04: qty 2

## 2014-04-04 MED ORDER — VITAMIN B-12 1000 MCG PO TABS
1000.0000 ug | ORAL_TABLET | Freq: Every day | ORAL | Status: DC
  Administered 2014-04-05 – 2014-04-07 (×3): 1000 ug via ORAL
  Filled 2014-04-04 (×3): qty 1

## 2014-04-04 MED ORDER — POLYETHYLENE GLYCOL 3350 17 G PO PACK: 17.0000 g | PACK | Freq: Every day | ORAL | Status: DC | PRN

## 2014-04-04 MED ORDER — METOCLOPRAMIDE HCL 5 MG/ML IJ SOLN: 5.0000 mg | Freq: Three times a day (TID) | INTRAMUSCULAR | Status: DC | PRN

## 2014-04-04 MED ORDER — METHOCARBAMOL 500 MG PO TABS
ORAL_TABLET | ORAL | Status: AC
  Administered 2014-04-04: 500 mg
  Filled 2014-04-04: qty 1

## 2014-04-04 MED ORDER — HYDROMORPHONE HCL PF 1 MG/ML IJ SOLN: 1.0000 mg | INTRAMUSCULAR | Status: DC | PRN

## 2014-04-04 MED ORDER — METHOCARBAMOL 1000 MG/10ML IJ SOLN
500.0000 mg | Freq: Four times a day (QID) | INTRAMUSCULAR | Status: DC | PRN
  Filled 2014-04-04: qty 5

## 2014-04-04 MED ORDER — PROMETHAZINE HCL 25 MG/ML IJ SOLN: 6.2500 mg | INTRAMUSCULAR | Status: DC | PRN

## 2014-04-04 MED ORDER — OXYCODONE HCL 5 MG/5ML PO SOLN: 5.0000 mg | Freq: Once | ORAL | Status: AC | PRN

## 2014-04-04 MED ORDER — HYDROMORPHONE HCL PF 1 MG/ML IJ SOLN
0.2500 mg | INTRAMUSCULAR | Status: DC | PRN
  Administered 2014-04-04: 0.5 mg via INTRAVENOUS

## 2014-04-04 MED ORDER — SODIUM CHLORIDE 0.9 % IR SOLN
Status: DC | PRN
  Administered 2014-04-04: 3000 mL

## 2014-04-04 MED ORDER — FENTANYL CITRATE 0.05 MG/ML IJ SOLN
50.0000 ug | INTRAMUSCULAR | Status: DC | PRN
  Administered 2014-04-04: 50 ug via INTRAVENOUS

## 2014-04-04 MED ORDER — NEOSTIGMINE METHYLSULFATE 10 MG/10ML IV SOLN
INTRAVENOUS | Status: DC | PRN
  Administered 2014-04-04: 4 mg via INTRAVENOUS

## 2014-04-04 MED ORDER — ONDANSETRON HCL 4 MG/2ML IJ SOLN
INTRAMUSCULAR | Status: AC
  Filled 2014-04-04: qty 2

## 2014-04-04 MED ORDER — ROCURONIUM BROMIDE 50 MG/5ML IV SOLN
INTRAVENOUS | Status: AC
  Filled 2014-04-04: qty 1

## 2014-04-04 MED ORDER — MIDAZOLAM HCL 2 MG/2ML IJ SOLN
1.0000 mg | INTRAMUSCULAR | Status: DC | PRN
  Administered 2014-04-04: 1 mg via INTRAVENOUS

## 2014-04-04 MED ORDER — LACTATED RINGERS IV SOLN
INTRAVENOUS | Status: DC
  Administered 2014-04-04: 50 mL/h via INTRAVENOUS

## 2014-04-04 MED ORDER — FENTANYL CITRATE 0.05 MG/ML IJ SOLN
INTRAMUSCULAR | Status: DC | PRN
  Administered 2014-04-04: 50 ug via INTRAVENOUS
  Administered 2014-04-04: 100 ug via INTRAVENOUS
  Administered 2014-04-04: 50 ug via INTRAVENOUS
  Administered 2014-04-04: 25 ug via INTRAVENOUS
  Administered 2014-04-04: 50 ug via INTRAVENOUS
  Administered 2014-04-04: 100 ug via INTRAVENOUS
  Administered 2014-04-04: 25 ug via INTRAVENOUS

## 2014-04-04 MED ORDER — ACETAMINOPHEN 325 MG PO TABS: 650.0000 mg | ORAL_TABLET | Freq: Four times a day (QID) | ORAL | Status: DC | PRN

## 2014-04-04 MED ORDER — TRANEXAMIC ACID 100 MG/ML IV SOLN
1000.0000 mg | INTRAVENOUS | Status: AC
  Administered 2014-04-04: 1000 mg via INTRAVENOUS
  Filled 2014-04-04: qty 10

## 2014-04-04 MED ORDER — PROPOFOL 10 MG/ML IV BOLUS
INTRAVENOUS | Status: DC | PRN
  Administered 2014-04-04: 170 mg via INTRAVENOUS

## 2014-04-04 MED ORDER — HYDROMORPHONE HCL PF 1 MG/ML IJ SOLN
INTRAMUSCULAR | Status: AC
  Filled 2014-04-04: qty 1

## 2014-04-04 MED ORDER — ONDANSETRON HCL 4 MG/2ML IJ SOLN
INTRAMUSCULAR | Status: DC | PRN
  Administered 2014-04-04: 4 mg via INTRAVENOUS

## 2014-04-04 MED ORDER — DOCUSATE SODIUM 100 MG PO CAPS
100.0000 mg | ORAL_CAPSULE | Freq: Two times a day (BID) | ORAL | Status: DC
  Administered 2014-04-04 – 2014-04-07 (×6): 100 mg via ORAL
  Filled 2014-04-04 (×6): qty 1

## 2014-04-04 MED ORDER — 0.9 % SODIUM CHLORIDE (POUR BTL) OPTIME
TOPICAL | Status: DC | PRN
  Administered 2014-04-04: 1000 mL

## 2014-04-04 MED ORDER — ONDANSETRON HCL 4 MG PO TABS
4.0000 mg | ORAL_TABLET | Freq: Four times a day (QID) | ORAL | Status: DC | PRN
  Administered 2014-04-05: 4 mg via ORAL
  Filled 2014-04-04: qty 1

## 2014-04-04 MED ORDER — FENTANYL CITRATE 0.05 MG/ML IJ SOLN
INTRAMUSCULAR | Status: AC
  Administered 2014-04-04: 50 ug via INTRAVENOUS
  Filled 2014-04-04: qty 2

## 2014-04-04 MED ORDER — ALUM & MAG HYDROXIDE-SIMETH 200-200-20 MG/5ML PO SUSP: 30.0000 mL | ORAL | Status: DC | PRN

## 2014-04-04 MED ORDER — GLYCOPYRROLATE 0.2 MG/ML IJ SOLN
INTRAMUSCULAR | Status: AC
  Filled 2014-04-04: qty 3

## 2014-04-04 MED ORDER — SODIUM CHLORIDE 0.9 % IV SOLN
INTRAVENOUS | Status: DC
  Administered 2014-04-04: 22:00:00 via INTRAVENOUS

## 2014-04-04 MED ORDER — ZINC 15 MG PO CAPS: 15.0000 mg | ORAL_CAPSULE | Freq: Every day | ORAL | Status: DC

## 2014-04-04 MED ORDER — OXYCODONE HCL 5 MG PO TABS
5.0000 mg | ORAL_TABLET | ORAL | Status: DC | PRN
  Administered 2014-04-04: 10 mg via ORAL
  Administered 2014-04-05 (×2): 5 mg via ORAL
  Administered 2014-04-05 – 2014-04-06 (×3): 10 mg via ORAL
  Administered 2014-04-07 (×3): 5 mg via ORAL
  Filled 2014-04-04 (×4): qty 2
  Filled 2014-04-04 (×3): qty 1
  Filled 2014-04-04: qty 2
  Filled 2014-04-04: qty 1
  Filled 2014-04-04: qty 2
  Filled 2014-04-04: qty 1

## 2014-04-04 MED ORDER — OXYCODONE HCL 5 MG PO TABS
5.0000 mg | ORAL_TABLET | Freq: Once | ORAL | Status: AC | PRN
  Administered 2014-04-04: 5 mg via ORAL

## 2014-04-04 MED ORDER — OXYCODONE HCL 5 MG PO TABS
ORAL_TABLET | ORAL | Status: AC
  Filled 2014-04-04: qty 1

## 2014-04-04 SURGICAL SUPPLY — 64 items
BANDAGE ELASTIC 6 VELCRO ST LF (GAUZE/BANDAGES/DRESSINGS) ×4 IMPLANT
BANDAGE ESMARK 6X9 LF (GAUZE/BANDAGES/DRESSINGS) ×1 IMPLANT
BLADE SAG 18X100X1.27 (BLADE) ×3 IMPLANT
BNDG CMPR 9X6 STRL LF SNTH (GAUZE/BANDAGES/DRESSINGS) ×1
BNDG ESMARK 6X9 LF (GAUZE/BANDAGES/DRESSINGS) ×3
BOWL SMART MIX CTS (DISPOSABLE) IMPLANT
CEMENT BONE SIMPLEX SPEEDSET (Cement) ×4 IMPLANT
CLOSURE STERI-STRIP 1/2X4 (GAUZE/BANDAGES/DRESSINGS) ×1
CLOSURE WOUND 1/2 X4 (GAUZE/BANDAGES/DRESSINGS) ×2
CLSR STERI-STRIP ANTIMIC 1/2X4 (GAUZE/BANDAGES/DRESSINGS) ×1 IMPLANT
COVER SURGICAL LIGHT HANDLE (MISCELLANEOUS) ×3 IMPLANT
CUFF TOURNIQUET SINGLE 34IN LL (TOURNIQUET CUFF) ×3 IMPLANT
CUFF TOURNIQUET SINGLE 44IN (TOURNIQUET CUFF) IMPLANT
DRAPE INCISE IOBAN 66X45 STRL (DRAPES) ×1 IMPLANT
DRAPE ORTHO SPLIT 77X108 STRL (DRAPES) ×6
DRAPE PROXIMA HALF (DRAPES) ×3 IMPLANT
DRAPE SURG ORHT 6 SPLT 77X108 (DRAPES) ×2 IMPLANT
DRAPE U-SHAPE 47X51 STRL (DRAPES) ×3 IMPLANT
DRSG PAD ABDOMINAL 8X10 ST (GAUZE/BANDAGES/DRESSINGS) ×3 IMPLANT
DURAPREP 26ML APPLICATOR (WOUND CARE) ×3 IMPLANT
ELECT REM PT RETURN 9FT ADLT (ELECTROSURGICAL) ×3
ELECTRODE REM PT RTRN 9FT ADLT (ELECTROSURGICAL) ×1 IMPLANT
EVACUATOR 1/8 PVC DRAIN (DRAIN) ×2 IMPLANT
FACESHIELD WRAPAROUND (MASK) ×9 IMPLANT
FACESHIELD WRAPAROUND OR TEAM (MASK) ×3 IMPLANT
GAUZE XEROFORM 1X8 LF (GAUZE/BANDAGES/DRESSINGS) ×3 IMPLANT
GLOVE BIO SURGEON STRL SZ8 (GLOVE) ×6 IMPLANT
GLOVE BIOGEL PI IND STRL 8 (GLOVE) ×1 IMPLANT
GLOVE BIOGEL PI INDICATOR 8 (GLOVE) ×2
GLOVE ECLIPSE 7.0 STRL STRAW (GLOVE) ×3 IMPLANT
GLOVE ORTHO TXT STRL SZ7.5 (GLOVE) ×3 IMPLANT
GOWN STRL REUS W/ TWL LRG LVL3 (GOWN DISPOSABLE) ×2 IMPLANT
GOWN STRL REUS W/ TWL XL LVL3 (GOWN DISPOSABLE) ×4 IMPLANT
GOWN STRL REUS W/TWL LRG LVL3 (GOWN DISPOSABLE) ×6
GOWN STRL REUS W/TWL XL LVL3 (GOWN DISPOSABLE) ×12
HANDPIECE INTERPULSE COAX TIP (DISPOSABLE) ×3
IMMOBILIZER KNEE 22 UNIV (SOFTGOODS) ×2 IMPLANT
KIT BASIN OR (CUSTOM PROCEDURE TRAY) ×3 IMPLANT
KIT ROOM TURNOVER OR (KITS) ×3 IMPLANT
KNEE/VIT E POLY LINER LEVEL 1B ×2 IMPLANT
MANIFOLD NEPTUNE II (INSTRUMENTS) ×3 IMPLANT
NS IRRIG 1000ML POUR BTL (IV SOLUTION) ×3 IMPLANT
PACK TOTAL JOINT (CUSTOM PROCEDURE TRAY) ×3 IMPLANT
PAD ARMBOARD 7.5X6 YLW CONV (MISCELLANEOUS) ×6 IMPLANT
PADDING CAST COTTON 6X4 STRL (CAST SUPPLIES) ×3 IMPLANT
SET HNDPC FAN SPRY TIP SCT (DISPOSABLE) IMPLANT
SET PAD KNEE POSITIONER (MISCELLANEOUS) ×3 IMPLANT
SPONGE GAUZE 4X4 12PLY (GAUZE/BANDAGES/DRESSINGS) ×3 IMPLANT
SPONGE GAUZE 4X4 12PLY STER LF (GAUZE/BANDAGES/DRESSINGS) ×2 IMPLANT
STAPLER VISISTAT 35W (STAPLE) IMPLANT
STRIP CLOSURE SKIN 1/2X4 (GAUZE/BANDAGES/DRESSINGS) ×4 IMPLANT
SUCTION FRAZIER TIP 10 FR DISP (SUCTIONS) IMPLANT
SUT VIC AB 0 CT1 27 (SUTURE) ×6
SUT VIC AB 0 CT1 27XBRD ANBCTR (SUTURE) IMPLANT
SUT VIC AB 1 CT1 27 (SUTURE) ×6
SUT VIC AB 1 CT1 27XBRD ANBCTR (SUTURE) ×2 IMPLANT
SUT VIC AB 2-0 CT1 27 (SUTURE) ×6
SUT VIC AB 2-0 CT1 TAPERPNT 27 (SUTURE) ×2 IMPLANT
SUT VIC AB 4-0 PS2 27 (SUTURE) ×3 IMPLANT
TOWEL OR 17X24 6PK STRL BLUE (TOWEL DISPOSABLE) ×3 IMPLANT
TOWEL OR 17X26 10 PK STRL BLUE (TOWEL DISPOSABLE) ×3 IMPLANT
TRAY FOLEY CATH 16FRSI W/METER (SET/KITS/TRAYS/PACK) IMPLANT
WATER STERILE IRR 1000ML POUR (IV SOLUTION) ×2 IMPLANT
WRAP KNEE MAXI GEL POST OP (GAUZE/BANDAGES/DRESSINGS) ×3 IMPLANT

## 2014-04-04 NOTE — Transfer of Care (Signed)
Immediate Anesthesia Transfer of Care Note  Patient: Ann Rangel  Procedure(s) Performed: Procedure(s): LEFT TOTAL KNEE ARTHROPLASTY (Left)  Patient Location: PACU  Anesthesia Type:General and GA combined with regional for post-op pain  Level of Consciousness: awake, alert  and oriented  Airway & Oxygen Therapy: Patient Spontanous Breathing and Patient connected to nasal cannula oxygen  Post-op Assessment: Report given to PACU RN and Post -op Vital signs reviewed and stable  Post vital signs: Reviewed and stable  Complications: No apparent anesthesia complications

## 2014-04-04 NOTE — H&P (Signed)
TOTAL KNEE ADMISSION H&P  Patient is being admitted for left total knee arthroplasty.  Subjective:  Chief Complaint:left knee pain.  HPI: Ann Rangel, 73 y.o. female, has a history of pain and functional disability in the left knee due to arthritis and has failed non-surgical conservative treatments for greater than 12 weeks to includeNSAID's and/or analgesics, corticosteriod injections, use of assistive devices and activity modification.  Onset of symptoms was gradual, starting 3 years ago with gradually worsening course since that time. The patient noted no past surgery on the left knee(s).  Patient currently rates pain in the left knee(s) at 10 out of 10 with activity. Patient has night pain, worsening of pain with activity and weight bearing, pain that interferes with activities of daily living, pain with passive range of motion, crepitus and joint swelling.  Patient has evidence of subchondral sclerosis, periarticular osteophytes and joint space narrowing by imaging studies. There is no active infection.  Patient Active Problem List   Diagnosis Date Noted  . Arthritis of knee, left 04/04/2014  . Arthritis of knee, right 04/05/2013   Past Medical History  Diagnosis Date  . Arthritis   . Anemia     Past Surgical History  Procedure Laterality Date  . Abdominal hysterectomy    . Foot foreign body removal    . Total knee arthroplasty Right 04/05/2013    Procedure: RIGHT TOTAL KNEE ARTHROPLASTY;  Surgeon: Mcarthur Rossetti, MD;  Location: [facility];  Service: Orthopedics;  Laterality: Right;  . Appendectomy      age 73    No prescriptions prior to admission   Allergies  Allergen Reactions  . Penicillins Hives    History  Substance Use Topics  . Smoking status: Former Research scientist (life sciences)  . Smokeless tobacco: Never Used  . Alcohol Use: No    No family history on file.   Review of Systems  Musculoskeletal: Positive for joint pain.  All other systems reviewed and are  negative.   Objective:  Physical Exam  Constitutional: She is oriented to person, place, and time. She appears well-developed and well-nourished.  HENT:  Head: Normocephalic and atraumatic.  Eyes: EOM are normal. Pupils are equal, round, and reactive to light.  Neck: Normal range of motion. Neck supple.  Cardiovascular: Normal rate and regular rhythm.   Respiratory: Effort normal and breath sounds normal.  GI: Soft. Bowel sounds are normal.  Musculoskeletal:       Left knee: She exhibits decreased range of motion and effusion. Tenderness found. Medial joint line and lateral joint line tenderness noted.  Neurological: She is alert and oriented to person, place, and time.  Skin: Skin is warm and dry.  Psychiatric: She has a normal mood and affect.    Vital signs in last 24 hours:    Labs:   Estimated body mass index is 33.59 kg/(m^2) as calculated from the following:   Height as of 04/07/13: 5' 8.5" (1.74 m).   Weight as of 03/28/13: 101.696 kg (224 lb 3.2 oz).   Imaging Review Plain radiographs demonstrate severe degenerative joint disease of the left knee(s). The overall alignment ismild varus. The bone quality appears to be good for age and reported activity level.  Assessment/Plan:  End stage arthritis, left knee   The patient history, physical examination, clinical judgment of the provider and imaging studies are consistent with end stage degenerative joint disease of the left knee(s) and total knee arthroplasty is deemed medically necessary. The treatment options including medical management, injection therapy  arthroscopy and arthroplasty were discussed at length. The risks and benefits of total knee arthroplasty were presented and reviewed. The risks due to aseptic loosening, infection, stiffness, patella tracking problems, thromboembolic complications and other imponderables were discussed. The patient acknowledged the explanation, agreed to proceed with the plan and consent  was signed. Patient is being admitted for inpatient treatment for surgery, pain control, PT, OT, prophylactic antibiotics, VTE prophylaxis, progressive ambulation and ADL's and discharge planning. The patient is planning to be discharged home with home health services

## 2014-04-04 NOTE — Anesthesia Procedure Notes (Addendum)
Anesthesia Regional Block:  Adductor canal block  Pre-Anesthetic Checklist: ,, timeout performed, Correct Patient, Correct Site, Correct Laterality, Correct Procedure, Correct Position, site marked, Risks and benefits discussed,  Surgical consent,  Pre-op evaluation,  At surgeon's request and post-op pain management  Laterality: Left and Upper  Prep: chloraprep       Needles:   Needle Type: Echogenic Needle     Needle Length: 9cm 9 cm Needle Gauge: 22 and 22 G  Needle insertion depth: 8 cm   Additional Needles:  Procedures: ultrasound guided (picture in chart) Adductor canal block Narrative:  Start time: 04/04/2014 3:00 PM End time: 04/04/2014 3:15 PM Injection made incrementally with aspirations every 5 mL.  Performed by: Personally  Anesthesiologist: T Massagee  Additional Notes: Tolerated well   Procedure Name: Intubation Date/Time: 04/04/2014 4:19 PM Performed by: Eligha Bridegroom Pre-anesthesia Checklist: Patient identified, Patient being monitored, Emergency Drugs available, Timeout performed and Suction available Patient Re-evaluated:Patient Re-evaluated prior to inductionOxygen Delivery Method: Circle system utilized Preoxygenation: Pre-oxygenation with 100% oxygen Intubation Type: IV induction Ventilation: Mask ventilation without difficulty and Oral airway inserted - appropriate to patient size Laryngoscope Size: Mac and 3 Grade View: Grade II Tube type: Oral Tube size: 7.0 mm Number of attempts: 2 Airway Equipment and Method: Stylet Secured at: 21 cm Tube secured with: Tape Dental Injury: Teeth and Oropharynx as per pre-operative assessment

## 2014-04-04 NOTE — Brief Op Note (Signed)
04/04/2014  6:15 PM  PATIENT:  Ann Rangel  73 y.o. female  PRE-OPERATIVE DIAGNOSIS:  Left knee osteoarthritis  POST-OPERATIVE DIAGNOSIS:  Left knee osteoarthritis  PROCEDURE:  Procedure(s): LEFT TOTAL KNEE ARTHROPLASTY (Left)  SURGEON:  Surgeon(s) and Role:    * Mcarthur Rossetti, MD - Primary  PHYSICIAN ASSISTANT: Benita Stabile, PA-C  ANESTHESIA:   regional and general  EBL:  Total I/O In: 1000 [I.V.:1000] Out: 225 [Urine:125; Blood:100]  BLOOD ADMINISTERED:none  DRAINS: (medium) Hemovact drain(s) in the knee with  Suction Open   LOCAL MEDICATIONS USED:  NONE  SPECIMEN:  No Specimen  DISPOSITION OF SPECIMEN:  N/A  COUNTS:  YES  TOURNIQUET:   Total Tourniquet Time Documented: Thigh (Left) - 59 minutes Total: Thigh (Left) - 59 minutes   DICTATION: .Other Dictation: Dictation Number [id number]  PLAN OF CARE: Admit to inpatient   PATIENT DISPOSITION:  PACU - hemodynamically stable.   Delay start of Pharmacological VTE agent (>24hrs) due to surgical blood loss or risk of bleeding: no

## 2014-04-04 NOTE — Anesthesia Preprocedure Evaluation (Signed)
Anesthesia Evaluation   Patient awake    Reviewed: Allergy & Precautions, H&P , NPO status   History of Anesthesia Complications Negative for: history of anesthetic complications  Airway Mallampati: I      Dental  (+) Teeth Intact   Pulmonary former smoker,          Cardiovascular negative cardio ROS  Rhythm:Regular Rate:Normal     Neuro/Psych negative neurological ROS     GI/Hepatic negative GI ROS, Neg liver ROS,   Endo/Other  Morbid obesity  Renal/GU negative Renal ROS     Musculoskeletal  (+) Arthritis -,   Abdominal (+) + obese,   Peds  Hematology   Anesthesia Other Findings   Reproductive/Obstetrics                           Anesthesia Physical Anesthesia Plan  ASA: II  Anesthesia Plan: General   Post-op Pain Management:    Induction: Intravenous  Airway Management Planned: Oral ETT  Additional Equipment:   Intra-op Plan:   Post-operative Plan: Extubation in OR  Informed Consent: I have reviewed the patients History and Physical, chart, labs and discussed the procedure including the risks, benefits and alternatives for the proposed anesthesia with the patient or authorized representative who has indicated his/her understanding and acceptance.   Dental advisory given  Plan Discussed with: CRNA and Surgeon  Anesthesia Plan Comments:         Anesthesia Quick Evaluation

## 2014-04-04 NOTE — Anesthesia Postprocedure Evaluation (Signed)
Anesthesia Post Note  Patient: Ann Rangel  Procedure(s) Performed: Procedure(s) (LRB): LEFT TOTAL KNEE ARTHROPLASTY (Left)  Anesthesia type: general  Patient location: PACU  Post pain: Pain level controlled  Post assessment: Patient's Cardiovascular Status Stable  Last Vitals:  Filed Vitals:   04/04/14 2000  BP:   Pulse:   Temp: 36.7 C  Resp:     Post vital signs: Reviewed and stable  Level of consciousness: sedated  Complications: No apparent anesthesia complications

## 2014-04-04 NOTE — Progress Notes (Signed)
Orthopedic Tech Progress Note Patient Details:  Ann Rangel 1940/12/18 [id number]  CPM Left Knee CPM Left Knee: On Left Knee Flexion (Degrees): 60 Left Knee Extension (Degrees): 0 Additional Comments: applied overhead frame to bed   Braulio Bosch 04/04/2014, 7:24 PM

## 2014-04-05 LAB — BASIC METABOLIC PANEL
BUN: 12 mg/dL (ref 6–23)
CHLORIDE: 104 meq/L (ref 96–112)
CO2: 25 mEq/L (ref 19–32)
CREATININE: 0.77 mg/dL (ref 0.50–1.10)
Calcium: 8.5 mg/dL (ref 8.4–10.5)
GFR calc Af Amer: >90 mL/min (ref >90)
GFR calc non Af Amer: 82 mL/min — ABNORMAL LOW (ref >90)
GLUCOSE: 129 mg/dL — AB (ref 70–99)
POTASSIUM: 3.9 meq/L (ref 3.7–5.3)
Sodium: 141 mEq/L (ref 137–147)

## 2014-04-05 LAB — CBC
HEMATOCRIT: 35.8 % — AB (ref 36.0–46.0)
HEMOGLOBIN: 11.7 g/dL — AB (ref 12.0–15.0)
MCH: 30.2 pg (ref 26.0–34.0)
MCHC: 32.7 g/dL (ref 30.0–36.0)
MCV: 92.5 fL (ref 78.0–100.0)
Platelets: 182 10*3/uL (ref 150–400)
RBC: 3.87 MIL/uL (ref 3.87–5.11)
RDW: 14.2 % (ref 11.5–15.5)
WBC: 9.2 10*3/uL (ref 4.0–10.5)

## 2014-04-05 MED ORDER — HYDROCODONE-ACETAMINOPHEN 5-325 MG PO TABS
1.0000 | ORAL_TABLET | ORAL | Status: DC | PRN
  Administered 2014-04-05 – 2014-04-06 (×2): 1 via ORAL
  Filled 2014-04-05 (×2): qty 1

## 2014-04-05 NOTE — Care Management Note (Signed)
CARE MANAGEMENT NOTE 04/05/2014  Patient:  Ann Rangel, Ann Rangel   Account Number:  [id number]  Date Initiated:  04/05/2014  Documentation initiated by:  Ricki Miller  Subjective/Objective Assessment:   73 yr old female s/p left total hip arthroplasty.     Action/Plan:   Case manager spoke with patient concerning home health and DME needs. Patient preoperatively setup with [facility], no changes. Patient has family assistance at discharge.   Anticipated DC Date:  04/06/2014   Anticipated DC Plan:  [place]  CM consult      [facility] Choice  HOME HEALTH  DURABLE MEDICAL EQUIPMENT   Choice offered to / List presented to:  C-1 Patient   DME arranged  [facility]  3-N-1  CPM      DME agency  [employer]     [place] arranged  HH-2 PT      [place]   Status of service:  Completed, signed off Medicare Important Message given?  NA - LOS <3 / Initial given by admissions (If response is "NO", the following Medicare IM given date fields will be blank) Date Medicare IM given:  03/29/2014 Date Additional Medicare IM given:    Discharge Disposition:  [place]  Per UR Regulation:  Reviewed for med. necessity/level of care/duration of stay  If discussed at [place] of Stay Meetings, dates discussed:    Comments:

## 2014-04-05 NOTE — Progress Notes (Signed)
PT Cancellation Note  Patient Details Name: Ann Rangel MRN: [id number] DOB: 1941/09/06   Cancelled Treatment:    Reason Eval/Treat Not Completed: Patient declined, no reason specified  Pt reports she has worked enough with therapy today. States she will "progress more tomorrow." Explained the importance of being out of bed after surgery and working with therapy in order to safely d/c to home. Will follow up tomorrow.  [place], [place]   Ellouise Newer 04/05/2014, 3:50 PM

## 2014-04-05 NOTE — Evaluation (Signed)
Agree with evaluation results.   Cyndie Chime, OTR/L Occupational Therapist [phone]

## 2014-04-05 NOTE — Op Note (Signed)
Ann Rangel, Ann Rangel NO.:  [id number]  MEDICAL RECORD NO.:  [id number]  LOCATION:  5N16C                        FACILITY:  [facility]  PHYSICIAN:  Lind Guest. Ninfa Linden, M.D.DATE OF BIRTH:  1940/11/08  DATE OF PROCEDURE:  04/04/2014 DATE OF DISCHARGE:                              OPERATIVE REPORT   PREOPERATIVE DIAGNOSES:  Severe end-stage arthritis and degenerative joint disease, left knee.  POSTOPERATIVE DIAGNOSES:  Severe end-stage arthritis and degenerative joint disease, left knee.  PROCEDURE:  Left total knee arthroplasty.  IMPLANTS:  Stryker triathlon knee with size 4 femur, size 5 tibial tray, 9 mm polyethylene insert, size 29 patellar button.  SURGEON:  Lind Guest. Ninfa Linden, M.D.  ASSISTANT:  Erskine Emery, PA-C  ANESTHESIA: 1. Left leg femoral nerve block. 2. General.  ANTIBIOTICS:  900 mg of IV clindamycin.  TOURNIQUET TIME:  58 minutes.  BLOOD LOSS:  100-150 mL.  COMPLICATIONS:  None.  INDICATIONS:  Ann Rangel is a very pleasant 72 year old female with bilateral knee osteoarthritis.  Exactly, a year ago she underwent a successful right total knee arthroplasty.  She now presents to have the left knee replaced.  She has radiographic evidence of complete loss of her medial joint space, patellofemoral arthritic changes, as well as the tricompartmental disease.  There is a varus deformity of her knee as well.  She has attempted conservative treatment with anti- inflammatories, rest, ice, heat, quad strengthening exercises and intra- articular injections and all these itself.  It has gotten worse where it affects her activities of daily living, her quality of life, and her mobility.  At this point, she wished to proceed with a total knee arthroplasty.  She understands the risks of acute blood loss anemia, nerve and vessel injury, fracture, infection, and DVT.  She understands the goals are decreased pain, improved mobility and overall  improved quality of life.  DESCRIPTION OF PROCEDURE:  After informed consent was obtained, appropriate left leg was marked.  Anesthesia was obtained with femoral nerve block.  She was then brought to the operating room, placed supine on the operative table.  General anesthesia was then obtained.  A nonsterile tourniquet was placed around her upper left thigh.  Her left leg was prepped and draped with DuraPrep and sterile drapes including a sterile stockinette.  A time-out was called to identify correct patient, correct left knee.  We then used an Esmarch to wrap out the leg and the tourniquet was inflated to 300 mm of pressure.  I made a midline incision directly over the patella and carried this proximally and distally.  I dissected down the knee joint and performed a medial parapatellar arthrotomy.  We then found a large effusion from the knee and cleaned the knee of osteophytes, remnants of the medial and lateral meniscus and debris in general.  Then with the knee in a flexed position, we were using the extramedullary cutting guide for the tibia. We set our tibia for 9 mm off the high side, correcting for a neutral slope and neutral varus valgus.  We then made our tibial cut.  Attention was then turned to the femur.  We made it, placed the intramedullary guide for this femur  through the intramedullary notch.  We chose a cutting guide for a left femur and 5 degrees externally rotated and an 8 mm distal femoral cut.  We then made our distal femoral cut and we brought the leg back down to the extension, placed our extension block and I was pleased with our extension space.  We then removed all pins, going back to the femur.  We put our femoral sizing guide on and based off the epicondylar axis and Whiteside's line.  We were able to try our size for a size 4 femur and this corresponded with the other side.  We then placed our 4:1 cutting block for a size 4 femur and made our anterior and  posterior cuts followed by our chamfer cuts.  We then made our femoral box cut.  Attention was then turned back to the tibia.  We trialed for a size 5 tibia and I was pleased with this, so we placed the base plate for the size 2 tibia, and made our keel cut.  We then placed all trial components with a 9 mm polyethylene insert, and I was pleased with the alignment with her flexion-extension gaps as well.  We then cut the undersurface of the patella and drilled holes for patella button. With all trial components in place, I was pleased with the stability and range of motion.  We then removed all trial components and copiously irrigated the soft tissue with normal saline solution using pulsatile lavage.  We closed the joint capsule.  We then cemented the real Stryker Triathlon knee size 5 followed by the real size 4 femur.  We placed the real 9 mm polyethylene insert, cleaned the cement made from debris from the knee and placed a 29 patellar button.  Once the cement had hardened, we let the tourniquet down and hemostasis was obtained with electrocautery.  We placed a medium Hemovac in the arthrotomy, closed the arthrotomy with interrupted #1 Vicryl suture followed by 0 Vicryl in the deep tissue, 2-0 Vicryl in the subcutaneous tissue, and staples on the skin.  Well-padded sterile dressing was applied and she was awakened, extubated, and taken to recovery room in stable condition. All final counts were correct and there were no complications noted.  Of note, Carney Bern, PA-C assisted during the entire case and his assistance was crucial for the patient positioning, retracting, implant positioning and layered closure of the wound.     Lind Guest. Ninfa Linden, M.D.     CYB/MEDQ  D:  04/04/2014  T:  04/05/2014  Job:  [id number]

## 2014-04-05 NOTE — Progress Notes (Signed)
Subjective: 1 Day Post-Op Procedure(s) (LRB): LEFT TOTAL KNEE ARTHROPLASTY (Left) Patient reports pain as moderate.    Objective: Vital signs in last 24 hours: Temp:  [97.7 F (36.5 C)-98.7 F (37.1 C)] 98 F (36.7 C) (06/10 0634) Pulse Rate:  [68-97] 97 (06/10 0634) Resp:  [11-28] 16 (06/10 0634) BP: (107-147)/(54-112) 112/54 mmHg (06/10 0634) SpO2:  [95 %-100 %] 95 % (06/10 0634) Weight:  [103.874 kg (229 lb)] 103.874 kg (229 lb) (06/09 1303)  Intake/Output from previous day: 06/09 0701 - 06/10 0700 In: 1350 [P.O.:50; I.V.:1300] Out: 1227 [Urine:1125; Drains:2; Blood:100] Intake/Output this shift:    No results found for this basename: HGB,  in the last 72 hours No results found for this basename: WBC, RBC, HCT, PLT,  in the last 72 hours No results found for this basename: NA, K, CL, CO2, BUN, CREATININE, GLUCOSE, CALCIUM,  in the last 72 hours No results found for this basename: LABPT, INR,  in the last 72 hours  Sensation intact distally Intact pulses distally Dorsiflexion/Plantar flexion intact Incision: dressing C/D/I Compartment soft  Assessment/Plan: 1 Day Post-Op Procedure(s) (LRB): LEFT TOTAL KNEE ARTHROPLASTY (Left) Up with therapy  Jawaun Celmer Y 04/05/2014, 7:09 AM

## 2014-04-05 NOTE — Progress Notes (Signed)
Utilization review completed.  

## 2014-04-05 NOTE — Evaluation (Signed)
Occupational Therapy Evaluation Patient Details Name: Ann Rangel MRN: [id number] DOB: 09-30-1941 Today's Date: 04/05/2014    History of Present Illness 73 y.o. female s/p left TKA   Clinical Impression   Pt presents with acute pain and decreased strength interfering with her ability to perform her ADLs independently. Prior to admission, she was at a modified independent level and is currently at a mod assist for LB bathing/ dressing and transfers. Pt will have 24 hour supervision at home and would benefit from additional acute OT prior to d/c to get her to a supervision level of assistance. Will continue to follow.    Follow Up Recommendations  Supervision/Assistance - 24 hour    Equipment Recommendations  3 in 1 bedside comode    Recommendations for Other Services       Precautions / Restrictions Precautions Precautions: Knee Required Braces or Orthoses: Knee Immobilizer - Left Knee Immobilizer - Left: On when out of bed or walking Restrictions Weight Bearing Restrictions: Yes LLE Weight Bearing: Weight bearing as tolerated      Mobility Bed Mobility Overal bed mobility: Needs Assistance Bed Mobility: Supine to Sit     Supine to sit: Min assist     General bed mobility comments: Min assist for scoot forward with HOB flat. VCs for technique  Transfers Overall transfer level: Needs assistance Equipment used: Rolling walker (2 wheeled) Transfers: Sit to/from Stand Sit to Stand: From elevated surface;Mod assist         General transfer comment: Mod assist for boost to stand with VCs for hand placement and counted to 3 while rocking to build momentum for power up.    Balance Overall balance assessment: Needs assistance Sitting-balance support: No upper extremity supported;Feet supported Sitting balance-Leahy Scale: Fair     Standing balance support: Bilateral upper extremity supported Standing balance-Leahy Scale: Poor                               ADL Overall ADL's : Needs assistance/impaired Eating/Feeding: Independent Eating/Feeding Details (indicate cue type and reason): decreased appetite since surgery Grooming: Set up;Sitting   Upper Body Bathing: Sitting;Set up   Lower Body Bathing: Sit to/from stand;With adaptive equipment;Moderate assistance   Upper Body Dressing : Set up;Sitting   Lower Body Dressing: With adaptive equipment;Sit to/from stand;Moderate assistance   Toilet Transfer: Moderate assistance;BSC;RW   Toileting- Clothing Manipulation and Hygiene: Moderate assistance;Sit to/from stand       Functional mobility during ADLs: Moderate assistance;Rolling walker General ADL Comments: Pt hesitant to get OOB for therapy because she was "exhausted from PT". Pt able to get to EOB and sit>stand using RW with mod A. Pt has a history of orthostatic hypotension and was able to stand long enough to take her BP, 101/86 . Pt was educated on the importance of getting OOB and proper technique for getting to EOB.               Pertinent Vitals/Pain Pt c/o pain but did not rate. Nurse was notified and was given pain medicine prior to start of treatment.     Hand Dominance Right   Extremity/Trunk Assessment Upper Extremity Assessment Upper Extremity Assessment: Overall WFL for tasks assessed   Lower Extremity Assessment Lower Extremity Assessment: Defer to PT evaluation       Communication Communication Communication: No difficulties   Cognition Arousal/Alertness: Awake/alert Behavior During Therapy: WFL for tasks assessed/performed Overall Cognitive Status: Within Functional Limits  for tasks assessed                                Home Living Family/patient expects to be discharged to:: Private residence Living Arrangements: Alone Available Help at Discharge: Family;Available 24 hours/day Type of Home: Mobile home Home Access: Stairs to enter Entrance Stairs-Number of Steps: 4 Entrance  Stairs-Rails: Right;Left;Can reach both Home Layout: One level     Bathroom Shower/Tub: Tub/shower unit Shower/tub characteristics: Door Biochemist, clinical: Standard     Home Equipment: Environmental consultant - 2 wheels;Cane - quad;Tub bench;Hand held shower head;Adaptive equipment Adaptive Equipment: Reacher Additional Comments: will use BSC over toilet to raise toilet seat      Prior Functioning/Environment Level of [place]: [place]             OT Diagnosis: Generalized weakness;Acute pain   OT Problem List: Decreased strength;Decreased range of motion;Decreased activity tolerance;Impaired balance (sitting and/or standing);Pain;Decreased knowledge of use of DME or AE   OT Treatment/Interventions: Self-care/ADL training;DME and/or AE instruction;Patient/family education;Balance training    OT Goals(Current goals can be found in the care plan section) Acute Rehab OT Goals Patient Stated Goal: go home OT Goal Formulation: With patient Time For Goal Achievement: 04/12/14 Potential to Achieve Goals: Good ADL Goals Pt Will Perform Lower Body Bathing: with adaptive equipment;sit to/from stand;with supervision Pt Will Perform Lower Body Dressing: with adaptive equipment;with supervision;sit to/from stand Pt Will Transfer to Toilet: with supervision;ambulating;bedside commode (using RW and BSC over toilet) Pt Will Perform Toileting - Clothing Manipulation and hygiene: sit to/from stand;with supervision Pt Will Perform Tub/Shower Transfer: Tub transfer;with supervision;ambulating;tub bench;rolling walker  OT Frequency: Min 2X/week    End of Session Equipment Utilized During Treatment: Gait belt;Rolling walker;Left knee immobilizer CPM Left Knee CPM Left Knee: On Left Knee Flexion (Degrees): 60 Left Knee Extension (Degrees): -1 Additional Comments: pt left in CPM at end of tx and asked to leave it on for 2 hours  Activity Tolerance: Patient limited by pain;Patient limited by  fatigue Patient left: in bed;with call bell/phone within reach;with family/visitor present   Time:  -    Charges:    G-CodesLyda Perone 04-12-14, 5:52 PM

## 2014-04-05 NOTE — Evaluation (Signed)
Physical Therapy Evaluation Patient Details Name: Ann Rangel MRN: [id number] DOB: 11/27/1940 Today's Date: 04/05/2014   History of Present Illness  73 y.o. female s/p left TKA  Clinical Impression  Pt is s/p left TKA resulting in the deficits listed below (see PT Problem List). Pt will benefit from skilled PT to increase their independence and safety with mobility to allow discharge to the venue listed below. Pt with moderate dizziness upon standing. Plans to go home with 24 hr assistance from daughter-in-law; anticipate she will progress well enough over the next day or two in order to safely d/c home.     Follow Up Recommendations Home health PT;Supervision for mobility/OOB    Equipment Recommendations  None recommended by PT    Recommendations for Other Services OT consult     Precautions / Restrictions Precautions Precautions: Knee Precaution Comments: Reviewed knee precautions Required Braces or Orthoses: Knee Immobilizer - Left Restrictions Weight Bearing Restrictions: Yes LLE Weight Bearing: Weight bearing as tolerated      Mobility  Bed Mobility Overal bed mobility: Needs Assistance Bed Mobility: Supine to Sit     Supine to sit: Min assist     General bed mobility comments: Min assist for scoot forward with HOB flat. VCs for technique  Transfers Overall transfer level: Needs assistance Equipment used: Rolling walker (2 wheeled) Transfers: Sit to/from Stand Sit to Stand: Min assist;From elevated surface         General transfer comment: Min assist for boost to stand with VCs for hand placement and to prevent over reliance on RW. Performed x3 and progressed slightly with each attempt. Tactile cues to bring hips forward and prevent heavily leaning over RW.  Ambulation/Gait Ambulation/Gait assistance: Min assist Ambulation Distance (Feet): 5 Feet Assistive device: Rolling walker (2 wheeled) Gait Pattern/deviations: Step-to pattern;Decreased step length  - right;Decreased stance time - left;Antalgic   Gait velocity interpretation: Below normal speed for age/gender General Gait Details: Min assist for RW placement and education for safe DME use. VCs for sequencing and to take her time as she rushed secondary to pain and reported dizziness. Pre gait training with weight shift and cues for left knee extension in weight bearing on LLE.  Stairs            Wheelchair Mobility    Modified Rankin (Stroke Patients Only)       Balance Overall balance assessment: Needs assistance Sitting-balance support: No upper extremity supported;Feet supported Sitting balance-Leahy Scale: Fair     Standing balance support: Bilateral upper extremity supported Standing balance-Leahy Scale: Poor                               Pertinent Vitals/Pain 4/10 pain Patient repositioned in chair for comfort. Nurse notified    Home Living Family/patient expects to be discharged to:: Private residence Living Arrangements: Alone Available Help at Discharge: Family;Available 24 hours/day Type of Home: Mobile home Home Access: Stairs to enter Entrance Stairs-Rails: Right;Left;Can reach both Entrance Stairs-Number of Steps: 4 Home Layout: One level Home Equipment: Walker - 2 wheels;Cane - quad;Bedside commode;Shower seat      Prior Function Level of Independence: Independent               Hand Dominance   Dominant Hand: Right    Extremity/Trunk Assessment   Upper Extremity Assessment: Defer to OT evaluation           Lower Extremity Assessment: LLE deficits/detail  LLE Deficits / Details: decreased strength and ROM - unable to lift against gravity in supine     Communication   Communication: No difficulties  Cognition Arousal/Alertness: Awake/alert Behavior During Therapy: WFL for tasks assessed/performed Overall Cognitive Status: Within Functional Limits for tasks assessed                      General  Comments General comments (skin integrity, edema, etc.): Pt with dizziness upon standing that worsened after one minute in standing position; pt instructed to take a seat after symptoms worsened. Attempted this x3 with improvement in sympomts only upon sitting. Nuse notified    Exercises Total Joint Exercises Ankle Circles/Pumps: AROM;Both;10 reps;Supine Quad Sets: AROM;Left;10 reps;Seated      Assessment/Plan    PT Assessment Patient needs continued PT services  PT Diagnosis Difficulty walking;Abnormality of gait;Acute pain   PT Problem List Decreased strength;Decreased range of motion;Decreased activity tolerance;Decreased balance;Decreased mobility;Decreased knowledge of use of DME;Decreased safety awareness;Decreased knowledge of precautions;Pain  PT Treatment Interventions DME instruction;Gait training;Stair training;Functional mobility training;Therapeutic activities;Therapeutic exercise;Balance training;Neuromuscular re-education;Patient/family education;Modalities   PT Goals (Current goals can be found in the Care Plan section) Acute Rehab PT Goals Patient Stated Goal: go home PT Goal Formulation: With patient Time For Goal Achievement: 04/12/14 Potential to Achieve Goals: Good    Frequency 7X/week   Barriers to discharge        Co-evaluation               End of Session Equipment Utilized During Treatment: Gait belt;Left knee immobilizer Activity Tolerance: Patient tolerated treatment well Patient left: in chair;with call bell/phone within reach;with family/visitor present Nurse Communication: Mobility status;Other (comment) (Dizziness with standing)         Time: 1110-1146 PT Time Calculation (min): 36 min   Charges:   PT Evaluation $Initial PT Evaluation Tier I: 1 Procedure PT Treatments $Gait Training: 8-22 mins $Therapeutic Activity: 8-22 mins   PT G Codes:         Elayne Snare, [place]  Ellouise Newer 04/05/2014, 1:56 PM

## 2014-04-06 ENCOUNTER — Encounter (HOSPITAL_COMMUNITY): Payer: Self-pay | Admitting: Orthopaedic Surgery

## 2014-04-06 LAB — CBC
HEMATOCRIT: 34.3 % — AB (ref 36.0–46.0)
Hemoglobin: 11.3 g/dL — ABNORMAL LOW (ref 12.0–15.0)
MCH: 30.5 pg (ref 26.0–34.0)
MCHC: 32.9 g/dL (ref 30.0–36.0)
MCV: 92.5 fL (ref 78.0–100.0)
Platelets: 165 10*3/uL (ref 150–400)
RBC: 3.71 MIL/uL — ABNORMAL LOW (ref 3.87–5.11)
RDW: 14.2 % (ref 11.5–15.5)
WBC: 10.4 10*3/uL (ref 4.0–10.5)

## 2014-04-06 MED ORDER — HYDROCODONE-ACETAMINOPHEN 5-325 MG PO TABS: 1.0000 | ORAL_TABLET | ORAL | Status: DC | PRN

## 2014-04-06 MED ORDER — ASPIRIN 325 MG PO TBEC: 325.0000 mg | DELAYED_RELEASE_TABLET | Freq: Two times a day (BID) | ORAL | Status: AC

## 2014-04-06 NOTE — Progress Notes (Signed)
I have read and agree with this note.   Time QJ/FHL:45:62-56:38 Total time:28 minutes (2 Bernice)  [place], OTR/L [phone]

## 2014-04-06 NOTE — Discharge Instructions (Signed)
Pickup stool softener for constipation. Weight Bearing as tolerated  Progress activities slowly Keep dressing clean dry and intact, may shower with dressing intact. On Tuesday remove dressing, incision can get wet. After showering apply clean dressing.

## 2014-04-06 NOTE — Progress Notes (Signed)
Occupational Therapy Treatment and Discharge Patient Details Name: Ann Rangel MRN: [id number] DOB: 1941-08-08 Today's Date: 04/06/2014    History of present illness 73 y.o. female s/p left TKA   OT comments  Pt is progressing towards goals. Focus of todays treatment was on toilet transfer and hygiene. Pt was min assist for toileting tasks to steady the RW for safety. Pt was educated on proper LB dressing and tub transfer techniques, although pt declined practicing those tasks. Pt will have 24 hour assistance at home from family and no further OT is needed. We will sign off.  Follow Up Recommendations  Supervision/Assistance - 24 hour    Equipment Recommendations  3 in 1 bedside comode       Precautions / Restrictions Precautions Precautions: Knee Precaution Comments: Reviewed knee precautions Required Braces or Orthoses: Knee Immobilizer - Left Knee Immobilizer - Left: On when out of bed or walking Restrictions  LLE Weight Bearing: Weight bearing as tolerated       Mobility Bed Mobility Overal bed mobility: Needs Assistance Bed Mobility: Sit to Supine     Supine to sit: Min assist     General bed mobility comments: assist for LLE only. Pt able to reposition herself in bed.  Transfers Overall transfer level: Needs assistance Equipment used: Rolling walker (2 wheeled) Transfers: Sit to/from Stand Sit to Stand: Min assist         General transfer comment: min assist to steady RW and VC for LLE positioning.    Balance   Sitting-balance support: Feet supported;No upper extremity supported Sitting balance-Leahy Scale: Fair     Standing balance support: Single extremity supported;During functional activity Standing balance-Leahy Scale: Poor                     ADL Overall ADL's : Needs assistance/impaired                         Toilet Transfer: BSC;RW;Minimal assistance   Toileting- Clothing Manipulation and Hygiene: Sit to/from  stand;Minimal assistance Toileting - Clothing Manipulation Details (indicate cue type and reason): assist to steady walker during hygiene for safety     Functional mobility during ADLs: Rolling walker;Minimal assistance General ADL Comments: Pt up in chair and needing to use the bathroom; she was unable to make it to the toilet, saying she was dizzy, so she used the [facility] in the room. Pt educated on the importance of trying to get her socks and shoes on everyday, especially the LLE, to increase independence. CPM was placed on the pt at the end of tx and told to call the nurse when she was finished with dinner to turn it on. Nurse tech was notified.                Cognition   Behavior During Therapy: WFL for tasks assessed/performed Overall Cognitive Status: Within Functional Limits for tasks assessed                                    Pertinent Vitals/ Pain       No c/o pain.         Frequency Min 2X/week     Progress Toward Goals  OT Goals(current goals can now be found in the care plan section)  Progress towards OT goals: Progressing toward goals  Acute Rehab OT Goals Patient Stated Goal: go home  OT Goal Formulation: With patient Time For Goal Achievement: 04/12/14 Potential to Achieve Goals: Good  Plan Discharge plan remains appropriate       End of Session Equipment Utilized During Treatment: Gait belt;Rolling walker;Left knee immobilizer   Activity Tolerance Patient tolerated treatment well   Patient Left in bed;in CPM;with call bell/phone within reach;with family/visitor present   Nurse Communication Other (comment) (pt is in CPM and after she eats will be calling for nurse tech to turn it on)        Time: 3524-8185 OT Time Calculation (min): 28 min  Charges: OT Treatments $Self Care/Home Management : 23-37 mins  Lyda Perone 04/06/2014, 5:19 PM

## 2014-04-06 NOTE — Progress Notes (Signed)
Physical Therapy Treatment Patient Details Name: Ann Rangel MRN: [id number] DOB: 02/01/1941 Today's Date: 04/06/2014    History of Present Illness 73 y.o. female s/p left TKA    PT Comments    Patient is progressing well towards physical therapy goals, ambulating up to 50 feet with min guard assist and reports symptoms of dizziness are much improved today. Patient will continue to benefit from skilled physical therapy services to further improve independence with functional mobility. Plan to complete stair training at next PT session.    Follow Up Recommendations  Home health PT;Supervision for mobility/OOB     Equipment Recommendations  None recommended by PT    Recommendations for Other Services OT consult     Precautions / Restrictions Precautions Precautions: Knee Precaution Comments: Reviewed knee precautions Required Braces or Orthoses: Knee Immobilizer - Left Restrictions Weight Bearing Restrictions: Yes LLE Weight Bearing: Weight bearing as tolerated    Mobility  Bed Mobility Overal bed mobility: Needs Assistance Bed Mobility: Supine to Sit     Supine to sit: Supervision     General bed mobility comments: Supervision for safety. No physical assist needed. VCs for technique  Transfers Overall transfer level: Needs assistance Equipment used: Rolling walker (2 wheeled) Transfers: Sit to/from Stand Sit to Stand: Min assist         General transfer comment: Min assist for boost and to steady RW from lowest bed setting. Requires VCs for hand placement. Continues to lean heavily over RW.   Ambulation/Gait Ambulation/Gait assistance: Min guard Ambulation Distance (Feet): 50 Feet Assistive device: Rolling walker (2 wheeled) Gait Pattern/deviations: Step-to pattern;Decreased step length - right;Decreased stance time - left;Antalgic     General Gait Details: Min guard for safety with VCs for left knee extension in stance phase for quad activation. No  instances of knee buckling. Educated on safe use of DME with gait sequencing instructions   Stairs            Wheelchair Mobility    Modified Rankin (Stroke Patients Only)       Balance                                    Cognition Arousal/Alertness: Awake/alert Behavior During Therapy: WFL for tasks assessed/performed Overall Cognitive Status: Within Functional Limits for tasks assessed                      Exercises Total Joint Exercises Ankle Circles/Pumps: AROM;Both;10 reps;Supine Quad Sets: AROM;Left;10 reps;Supine Short Arc Quad: AAROM;Left;10 reps;Supine Heel Slides: AROM;Left;10 reps;Seated    General Comments General comments (skin integrity, edema, etc.): Pt with minimal dizziness today - states it is much improved from yesterday.      Pertinent Vitals/Pain Pt reports pain as "good" at rest Patient repositioned in chair for comfort.     Home Living                      Prior Function            PT Goals (current goals can now be found in the care plan section) Acute Rehab PT Goals PT Goal Formulation: With patient Time For Goal Achievement: 04/12/14 Potential to Achieve Goals: Good Progress towards PT goals: Progressing toward goals    Frequency  7X/week    PT Plan Current plan remains appropriate    Co-evaluation  End of Session Equipment Utilized During Treatment: Gait belt;Left knee immobilizer Activity Tolerance: Patient tolerated treatment well Patient left: in chair;with call bell/phone within reach     Time: 0844-0910 PT Time Calculation (min): 26 min  Charges:  $Gait Training: 8-22 mins $Therapeutic Exercise: 8-22 mins                    G Codes:      [employer], [place]  Ellouise Newer 04/06/2014, 10:12 AM

## 2014-04-06 NOTE — Progress Notes (Signed)
Physical Therapy Treatment Patient Details Name: Ann Rangel MRN: [id number] DOB: 07/18/1941 Today's Date: 04/06/2014    History of Present Illness 73 y.o. female s/p left TKA    PT Comments    Patient is progressing well towards physical therapy goals. Attempted stair training today and patient was able to perform at a close, min guard level for safety however fatigued very easily and proved a difficult task for her. Will practice once more tomorrow prior to d/c to ensure safety. Patient will continue to benefit from skilled physical therapy services at home with HHPT to further improve independence with functional mobility.    Follow Up Recommendations  Home health PT;Supervision for mobility/OOB     Equipment Recommendations  None recommended by PT    Recommendations for Other Services OT consult     Precautions / Restrictions Precautions Precautions: Knee Precaution Comments: Reviewed knee precautions Required Braces or Orthoses: Knee Immobilizer - Left Restrictions Weight Bearing Restrictions: Yes LLE Weight Bearing: Weight bearing as tolerated    Mobility  Bed Mobility Overal bed mobility: Needs Assistance Bed Mobility: Supine to Sit     Supine to sit: Supervision     General bed mobility comments: Supervision for safety. No physical assist needed. VCs for techniqu. HOB flat.  Transfers Overall transfer level: Needs assistance Equipment used: Rolling walker (2 wheeled) Transfers: Sit to/from Stand Sit to Stand: Min assist         General transfer comment: Min assist ti steady RW only. Performed from lowest bed setting and recliner. VCs for hand placement. relies heavily on RW. Educated to not pull from [place].  Ambulation/Gait Ambulation/Gait assistance: Min guard Ambulation Distance (Feet): 60 Feet Assistive device: Rolling walker (2 wheeled) Gait Pattern/deviations: Step-through pattern;Decreased step length - right;Decreased stance time -  left;Antalgic     General Gait Details: Education for step through gait pattern. VCs for forward gaze and left knee extension in stance phase. No instances of knee buckling with knee immobilizer in place   Stairs Stairs: Yes Stairs assistance: Min guard Stair Management: One rail Left;Step to pattern;Sideways Number of Stairs: 2 General stair comments: Min guard for safety. Right knee weakness making it difficult for pt to ascend steps. Easily fatigued and did not wish to attempt a second time. States she will be fine at home because she has plenty of help.  Wheelchair Mobility    Modified Rankin (Stroke Patients Only)       Balance                                    Cognition Arousal/Alertness: Awake/alert Behavior During Therapy: WFL for tasks assessed/performed Overall Cognitive Status: Within Functional Limits for tasks assessed                      Exercises      General Comments General comments (skin integrity, edema, etc.): Pt with persistent cough throughout therapy session      Pertinent Vitals/Pain Pt reports pain as "low" no numerical value given Patient repositioned in chair for comfort.     Home Living                      Prior Function            PT Goals (current goals can now be found in the care plan section) Acute Rehab PT Goals PT Goal Formulation:  With patient Time For Goal Achievement: 04/12/14 Potential to Achieve Goals: Good Progress towards PT goals: Progressing toward goals    Frequency  7X/week    PT Plan Current plan remains appropriate    Co-evaluation             End of Session Equipment Utilized During Treatment: Gait belt;Left knee immobilizer Activity Tolerance: Patient tolerated treatment well Patient left: in chair;with call bell/phone within reach;with family/visitor present     Time: 0375-4360 PT Time Calculation (min): 24 min  Charges:  $Gait Training: 23-37 mins                     G Codes:      [employer], [place]  Ellouise Newer 04/06/2014, 4:18 PM

## 2014-04-06 NOTE — Progress Notes (Signed)
Subjective: 2 Days Post-Op Procedure(s) (LRB): LEFT TOTAL KNEE ARTHROPLASTY (Left) Patient reports pain as moderate.    Objective: Vital signs in last 24 hours: Temp:  [98.4 F (36.9 C)-100.8 F (38.2 C)] 98.4 F (36.9 C) (06/11 0537) Pulse Rate:  [93-102] 100 (06/11 0537) Resp:  [12-20] 12 (06/11 0537) BP: (101-128)/(44-86) 124/44 mmHg (06/11 0537) SpO2:  [93 %-94 %] 94 % (06/11 0537)  Intake/Output from previous day: 06/10 0701 - 06/11 0700 In: 720 [P.O.:720] Out: 200 [Urine:200] Intake/Output this shift: Total I/O In: 240 [P.O.:240] Out: -    Recent Labs  04/05/14 0659 04/06/14 0601  HGB 11.7* 11.3*    Recent Labs  04/05/14 0659 04/06/14 0601  WBC 9.2 10.4  RBC 3.87 3.71*  HCT 35.8* 34.3*  PLT 182 165    Recent Labs  04/05/14 0659  NA 141  K 3.9  CL 104  CO2 25  BUN 12  CREATININE 0.77  GLUCOSE 129*  CALCIUM 8.5   No results found for this basename: LABPT, INR,  in the last 72 hours  Intact pulses distally Dorsiflexion/Plantar flexion intact Incision: scant drainage No cellulitis present Compartment soft  Assessment/Plan: 2 Days Post-Op Procedure(s) (LRB): LEFT TOTAL KNEE ARTHROPLASTY (Left) Up with therapy Plan for discharge tomorrow  Ann Rangel 04/06/2014, 6:51 AM

## 2014-04-07 ENCOUNTER — Encounter (HOSPITAL_COMMUNITY): Payer: Self-pay | Admitting: General Practice

## 2014-04-07 LAB — CBC
HCT: 31.4 % — ABNORMAL LOW (ref 36.0–46.0)
Hemoglobin: 10.3 g/dL — ABNORMAL LOW (ref 12.0–15.0)
MCH: 30 pg (ref 26.0–34.0)
MCHC: 32.8 g/dL (ref 30.0–36.0)
MCV: 91.5 fL (ref 78.0–100.0)
PLATELETS: 147 10*3/uL — AB (ref 150–400)
RBC: 3.43 MIL/uL — AB (ref 3.87–5.11)
RDW: 14.1 % (ref 11.5–15.5)
WBC: 7.7 10*3/uL (ref 4.0–10.5)

## 2014-04-07 NOTE — Discharge Summary (Signed)
Patient ID: Ann Rangel MRN: [id number] DOB/AGE: 02-10-1941 73 y.o.  Admit date: 04/04/2014 Discharge date: 04/07/2014  Admission Diagnoses:  Principal Problem:   Arthritis of knee, left Active Problems:   Status post total knee replacement   Discharge Diagnoses:  Same  Past Medical History  Diagnosis Date  . Arthritis   . Anemia     Surgeries: Procedure(s): LEFT TOTAL KNEE ARTHROPLASTY on 04/04/2014   Consultants:    Discharged Condition: Improved  Hospital Course: Ann Rangel is an 73 y.o. female who was admitted 04/04/2014 for operative treatment ofArthritis of knee, left. Patient has severe unremitting pain that affects sleep, daily activities, and work/hobbies. After pre-op clearance the patient was taken to the operating room on 04/04/2014 and underwent  Procedure(s): LEFT TOTAL KNEE ARTHROPLASTY.    Patient was given perioperative antibiotics: Anti-infectives   Start     Dose/Rate Route Frequency Ordered Stop   04/04/14 2359  clindamycin (CLEOCIN) IVPB 600 mg     600 mg 100 mL/hr over 30 Minutes Intravenous Every 6 hours 04/04/14 2023 04/05/14 0712   04/04/14 0600  clindamycin (CLEOCIN) IVPB 900 mg     900 mg 100 mL/hr over 30 Minutes Intravenous On call to O.R. 04/03/14 1356 04/04/14 1630       Patient was given sequential compression devices, early ambulation, and chemoprophylaxis to prevent DVT.  Patient benefited maximally from hospital stay and there were no complications.    Recent vital signs: Patient Vitals for the past 24 hrs:  BP Temp Temp src Pulse Resp SpO2  04/07/14 0422 108/54 mmHg 98.2 F (36.8 C) Oral 106 16 92 %  04/07/14 0400 - - - - 16 93 %  04/07/14 0000 - - - - 16 94 %  04/06/14 2050 108/49 mmHg 98.9 F (37.2 C) Oral 114 16 92 %  04/06/14 2000 - - - - 16 92 %  04/06/14 1411 126/55 mmHg 99.5 F (37.5 C) Oral 103 16 95 %     Recent laboratory studies:  Recent Labs  04/05/14 0659 04/06/14 0601 04/07/14 0622  WBC 9.2 10.4 7.7   HGB 11.7* 11.3* 10.3*  HCT 35.8* 34.3* 31.4*  PLT 182 165 147*  NA 141  --   --   K 3.9  --   --   CL 104  --   --   CO2 25  --   --   BUN 12  --   --   CREATININE 0.77  --   --   GLUCOSE 129*  --   --   CALCIUM 8.5  --   --      Discharge Medications:     Medication List    STOP taking these medications       aspirin 325 MG tablet  Replaced by:  aspirin 325 MG EC tablet     ibuprofen 200 MG tablet  Commonly known as:  ADVIL,MOTRIN     meloxicam 15 MG tablet  Commonly known as:  MOBIC     oxyCODONE-acetaminophen 5-325 MG per tablet  Commonly known as:  ROXICET      TAKE these medications       aspirin 325 MG EC tablet  Take 1 tablet (325 mg total) by mouth 2 (two) times daily after a meal.     CALTRATE 600+D PO  Take 1 tablet by mouth daily.     GERITOL PO  Take 1 tablet by mouth daily.     HYDROcodone-acetaminophen 5-325 MG per  tablet  Commonly known as:  NORCO/VICODIN  Take 1-2 tablets by mouth every 4 (four) hours as needed for moderate pain.     SUPER B COMPLEX PO  Take 1 tablet by mouth daily.     TRIPLE FLEX PO  Take 1 tablet by mouth daily.     vitamin B-12 1000 MCG tablet  Commonly known as:  CYANOCOBALAMIN  Take 1,000 mcg by mouth daily.     vitamin C 500 MG tablet  Commonly known as:  ASCORBIC ACID  Take 500 mg by mouth daily.     VITAMIN D-3 PO  Take 1 tablet by mouth daily.     VITAMIN E PO  Take 1 tablet by mouth daily.     Zinc 15 MG Caps  Take 15 mg by mouth daily.        Diagnostic Studies: Dg Knee Left Port  04/04/2014   CLINICAL DATA:  Postop for left knee arthroplasty.  EXAM: PORTABLE LEFT KNEE - 1-2 VIEW  COMPARISON:  None.  FINDINGS: Left knee arthroplasty. No evidence of periprosthetic fracture or acute hardware complication. Surgical drain within the joint. Overlying soft tissue swelling and surgical staples.  IMPRESSION: Expected appearance after left knee arthroplasty.   Electronically Signed   By: Abigail Miyamoto M.D.    On: 04/04/2014 19:21    Disposition: 01-Home or Self Care      Discharge Instructions   Discharge patient    Complete by:  As directed      Elevate operative extremity    Complete by:  As directed   Encourage patient to wiggle toes often     Weight bearing as tolerated    Complete by:  As directed            Follow-up Information   Follow up with [facility]. (Someone from [facility] will contact you concerning start date and time for physical therapy.)    Contact information:   [address] [place] [address] [phone]       Follow up with Mcarthur Rossetti, MD In 2 weeks.   Specialty:  Orthopedic Surgery   Contact information:   [place] [address] [phone]        Signed: Mcarthur Rossetti 04/07/2014, 7:00 AM

## 2014-04-07 NOTE — Progress Notes (Signed)
Subjective: 3 Days Post-Op Procedure(s) (LRB): LEFT TOTAL KNEE ARTHROPLASTY (Left) Patient reports pain as mild.  Asymptomatic acute blood loss anemia.   Objective: Vital signs in last 24 hours: Temp:  [98.2 F (36.8 C)-99.5 F (37.5 C)] 98.2 F (36.8 C) (06/12 0422) Pulse Rate:  [103-114] 106 (06/12 0422) Resp:  [16] 16 (06/12 0422) BP: (108-126)/(49-55) 108/54 mmHg (06/12 0422) SpO2:  [92 %-95 %] 92 % (06/12 0422)  Intake/Output from previous day: 06/11 0701 - 06/12 0700 In: 620 [P.O.:620] Out: 150 [Urine:150] Intake/Output this shift: Total I/O In: 240 [P.O.:240] Out: -    Recent Labs  04/05/14 0659 04/06/14 0601 04/07/14 0622  HGB 11.7* 11.3* 10.3*    Recent Labs  04/06/14 0601 04/07/14 0622  WBC 10.4 7.7  RBC 3.71* 3.43*  HCT 34.3* 31.4*  PLT 165 147*    Recent Labs  04/05/14 0659  NA 141  K 3.9  CL 104  CO2 25  BUN 12  CREATININE 0.77  GLUCOSE 129*  CALCIUM 8.5   No results found for this basename: LABPT, INR,  in the last 72 hours  Sensation intact distally Intact pulses distally Dorsiflexion/Plantar flexion intact Incision: no drainage No cellulitis present Compartment soft  Assessment/Plan: 3 Days Post-Op Procedure(s) (LRB): LEFT TOTAL KNEE ARTHROPLASTY (Left) Discharge home with home health  Mcarthur Rossetti 04/07/2014, 6:59 AM

## 2014-04-07 NOTE — Progress Notes (Signed)
Physical Therapy Treatment Patient Details Name: Ann Rangel MRN: [id number] DOB: August 07, 1941 Today's Date: 04/07/2014    History of Present Illness 73 y.o. female s/p left TKA    PT Comments    Pt progressing well towards physical therapy goals ambulating up to 115 feet at supervision level with no instances of knee buckling this AM. Pt declines further practice of stair navigation. Feel she is adequate for d/c from PT standpoint. Patient will continue to benefit from skilled physical therapy services at home with HHPT to further improve independence with functional mobility.   Follow Up Recommendations  Home health PT;Supervision for mobility/OOB     Equipment Recommendations  None recommended by PT    Recommendations for Other Services OT consult     Precautions / Restrictions Precautions Precautions: Knee Precaution Comments: Reviewed knee precautions Required Braces or Orthoses: Knee Immobilizer - Left Restrictions Weight Bearing Restrictions: Yes LLE Weight Bearing: Weight bearing as tolerated    Mobility  Bed Mobility Overal bed mobility: Needs Assistance Bed Mobility: Supine to Sit     Supine to sit: Supervision     General bed mobility comments: Supervision for safety. No physical assist needed. VCs for technique  Transfers Overall transfer level: Needs assistance Equipment used: Rolling walker (2 wheeled) Transfers: Sit to/from Stand Sit to Stand: Min guard         General transfer comment: Min guard for safety. VCs for hand placement. Performed from lowest bed setting and BSC. Improved control and did not require physical assist  Ambulation/Gait Ambulation/Gait assistance: Supervision Ambulation Distance (Feet): 115 Feet Assistive device: Rolling walker (2 wheeled) Gait Pattern/deviations: Step-through pattern;Decreased step length - right;Decreased stance time - left;Antalgic;Trunk flexed   Gait velocity interpretation: Below normal speed for  age/gender General Gait Details: Supervision for safety. No instances of knee buckling. VCs for walker placement and to extend left knee in stance phase with quad activation.   Stairs            Wheelchair Mobility    Modified Rankin (Stroke Patients Only)       Balance                                    Cognition Arousal/Alertness: Awake/alert Behavior During Therapy: WFL for tasks assessed/performed Overall Cognitive Status: Within Functional Limits for tasks assessed                      Exercises Total Joint Exercises Ankle Circles/Pumps: AROM;Both;10 reps;Supine Quad Sets: AROM;Left;10 reps;Supine Heel Slides: AROM;Left;10 reps;Seated Long Arc Quad: AAROM;Left;10 reps;Seated    General Comments General comments (skin integrity, edema, etc.): Pt declines performing additional stair training.      Pertinent Vitals/Pain Pt with no complaints of pain during PT session. Patient repositioned in chair for comfort.     Home Living Family/patient expects to be discharged to:: Private residence Living Arrangements: Alone                  Prior Function            PT Goals (current goals can now be found in the care plan section) Acute Rehab PT Goals PT Goal Formulation: With patient Time For Goal Achievement: 04/12/14 Potential to Achieve Goals: Good Progress towards PT goals: Progressing toward goals    Frequency  7X/week    PT Plan Current plan remains appropriate    Co-evaluation  End of Session Equipment Utilized During Treatment: Gait belt Activity Tolerance: Patient tolerated treatment well Patient left: in chair;with call bell/phone within reach;with family/visitor present     Time: 0017-4944 PT Time Calculation (min): 27 min  Charges:  $Gait Training: 8-22 mins $Therapeutic Exercise: 8-22 mins                    G Codes:      [employer], [place]  Ellouise Newer 04/07/2014, 1:25 PM

## 2014-11-22 IMAGING — CR DG KNEE 1-2V PORT*L*
2 series · 2 of 2 positions shown · non-contrast
Comparison: None.

CLINICAL DATA: Postop for left knee arthroplasty.

EXAM:
PORTABLE LEFT KNEE - 1-2 VIEW

[AP]
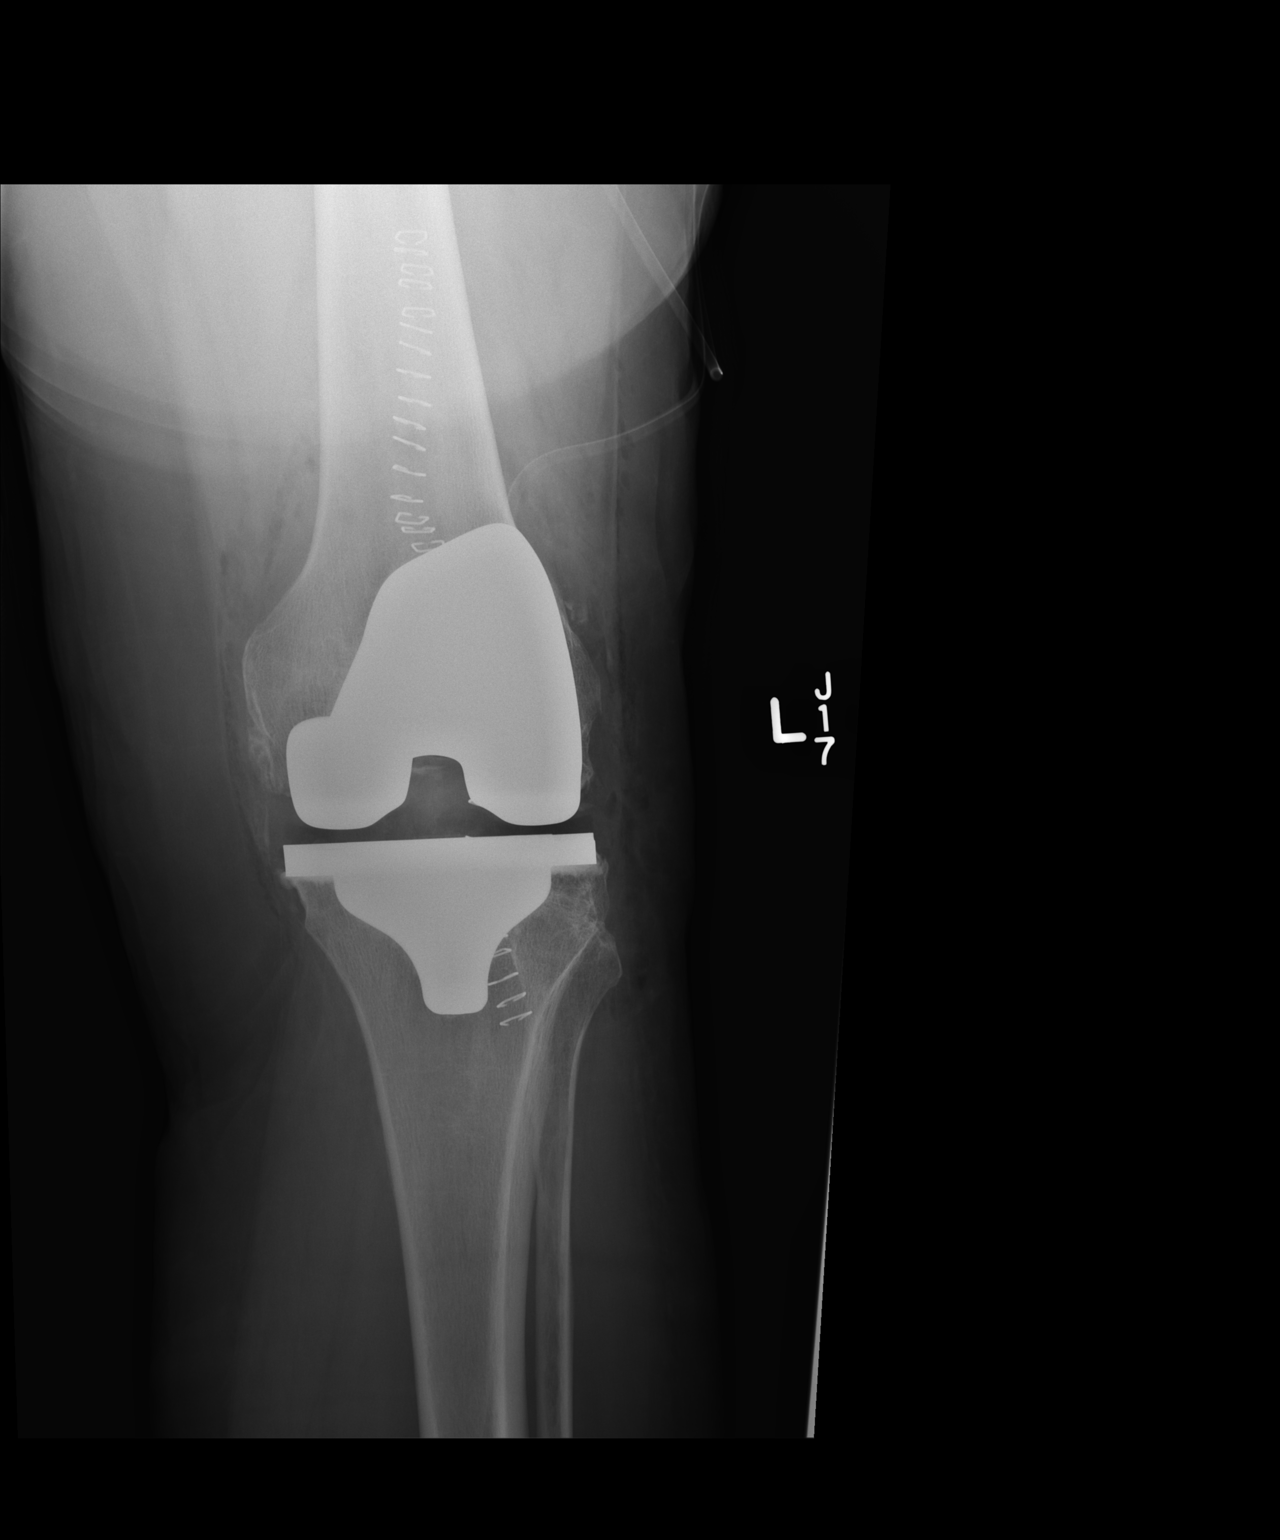

[xtable lateral]
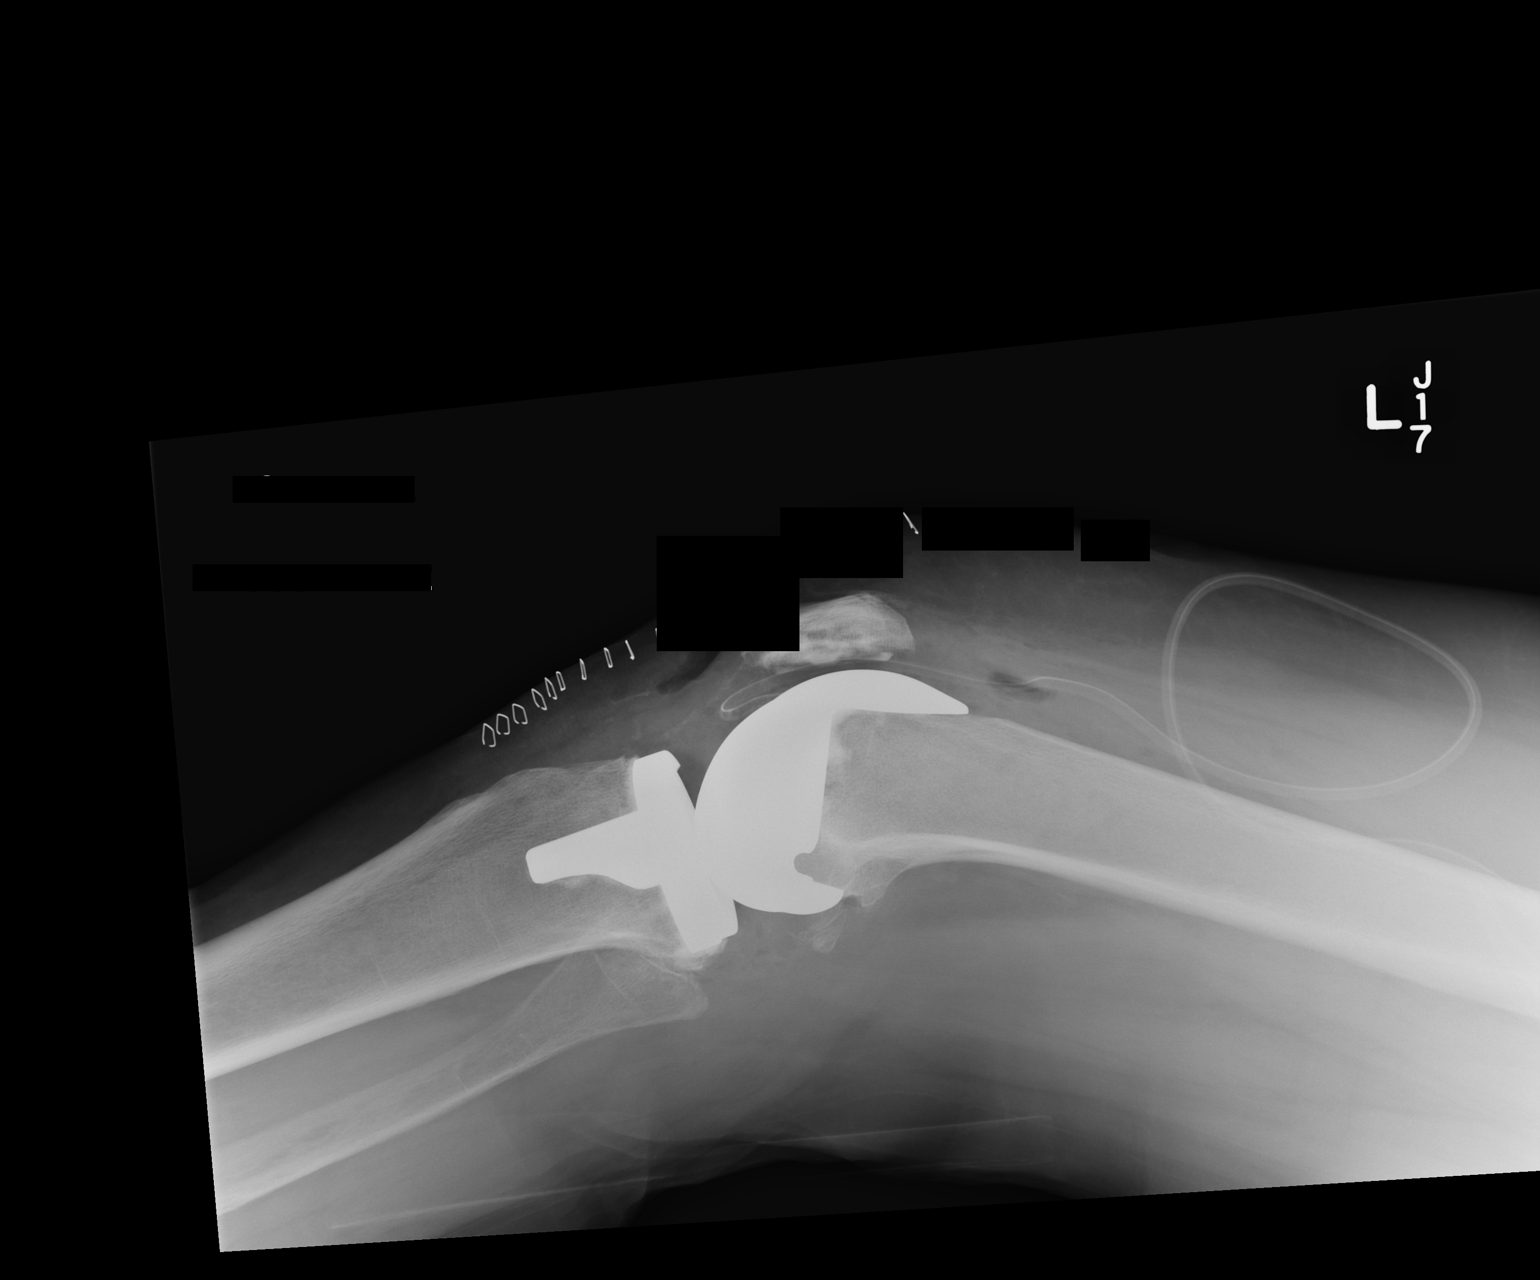

[2 of 2 positions shown; findings below may reference images not displayed]

FINDINGS: Left knee arthroplasty. No evidence of periprosthetic fracture or
acute hardware complication. Surgical drain within the joint.
Overlying soft tissue swelling and surgical staples.
IMPRESSION: Expected appearance after left knee arthroplasty.

## 2015-10-25 DIAGNOSIS — L723 Sebaceous cyst: Secondary | ICD-10-CM | POA: Diagnosis not present

## 2015-10-25 DIAGNOSIS — L989 Disorder of the skin and subcutaneous tissue, unspecified: Secondary | ICD-10-CM | POA: Diagnosis not present

## 2015-10-25 DIAGNOSIS — D1720 Benign lipomatous neoplasm of skin and subcutaneous tissue of unspecified limb: Secondary | ICD-10-CM | POA: Diagnosis not present

## 2016-06-24 DIAGNOSIS — H01009 Unspecified blepharitis unspecified eye, unspecified eyelid: Secondary | ICD-10-CM | POA: Diagnosis not present

## 2016-06-24 DIAGNOSIS — H5203 Hypermetropia, bilateral: Secondary | ICD-10-CM | POA: Diagnosis not present

## 2016-06-24 DIAGNOSIS — H2513 Age-related nuclear cataract, bilateral: Secondary | ICD-10-CM | POA: Diagnosis not present

## 2017-02-10 DIAGNOSIS — J111 Influenza due to unidentified influenza virus with other respiratory manifestations: Secondary | ICD-10-CM | POA: Diagnosis not present

## 2017-02-15 DIAGNOSIS — J209 Acute bronchitis, unspecified: Secondary | ICD-10-CM | POA: Diagnosis not present

## 2017-02-15 DIAGNOSIS — J019 Acute sinusitis, unspecified: Secondary | ICD-10-CM | POA: Diagnosis not present

## 2018-09-19 DIAGNOSIS — J209 Acute bronchitis, unspecified: Secondary | ICD-10-CM | POA: Diagnosis not present

## 2018-12-17 ENCOUNTER — Telehealth (INDEPENDENT_AMBULATORY_CARE_PROVIDER_SITE_OTHER): Payer: Self-pay | Admitting: Orthopaedic Surgery

## 2018-12-17 NOTE — Telephone Encounter (Signed)
Patient called asked if she can get her handicap placard renewed. Patient said hers run out February 24, 2019. The number to contact patient is [phone]

## 2018-12-20 NOTE — Telephone Encounter (Signed)
Ok

## 2018-12-20 NOTE — Telephone Encounter (Signed)
That will be fine. 

## 2018-12-21 NOTE — Telephone Encounter (Signed)
Waiting for signature from Dr Ninfa Linden.

## 2018-12-22 NOTE — Telephone Encounter (Signed)
Advised can pick up at front desk.

## 2019-08-18 DIAGNOSIS — Z20828 Contact with and (suspected) exposure to other viral communicable diseases: Secondary | ICD-10-CM | POA: Diagnosis not present

## 2020-03-19 DIAGNOSIS — H00016 Hordeolum externum left eye, unspecified eyelid: Secondary | ICD-10-CM | POA: Diagnosis not present

## 2020-06-26 DIAGNOSIS — Z20822 Contact with and (suspected) exposure to covid-19: Secondary | ICD-10-CM | POA: Diagnosis not present

## 2020-06-26 DIAGNOSIS — B349 Viral infection, unspecified: Secondary | ICD-10-CM | POA: Diagnosis not present

## 2020-06-26 DIAGNOSIS — R05 Cough: Secondary | ICD-10-CM | POA: Diagnosis not present

## 2020-06-29 DIAGNOSIS — U071 COVID-19: Secondary | ICD-10-CM | POA: Diagnosis not present

## 2021-03-28 DIAGNOSIS — D100 Benign neoplasm of lip: Secondary | ICD-10-CM | POA: Diagnosis not present

## 2021-03-28 DIAGNOSIS — K1370 Unspecified lesions of oral mucosa: Secondary | ICD-10-CM | POA: Diagnosis not present

## 2021-10-01 DIAGNOSIS — J101 Influenza due to other identified influenza virus with other respiratory manifestations: Secondary | ICD-10-CM | POA: Diagnosis not present

## 2021-10-01 DIAGNOSIS — J209 Acute bronchitis, unspecified: Secondary | ICD-10-CM | POA: Diagnosis not present

## 2022-01-06 DIAGNOSIS — J189 Pneumonia, unspecified organism: Secondary | ICD-10-CM | POA: Diagnosis not present

## 2022-03-10 DIAGNOSIS — H1032 Unspecified acute conjunctivitis, left eye: Secondary | ICD-10-CM | POA: Diagnosis not present

## 2022-03-10 DIAGNOSIS — J209 Acute bronchitis, unspecified: Secondary | ICD-10-CM | POA: Diagnosis not present

## 2022-03-10 DIAGNOSIS — R051 Acute cough: Secondary | ICD-10-CM | POA: Diagnosis not present

## 2022-04-04 ENCOUNTER — Ambulatory Visit: Payer: Medicare HMO | Admitting: Podiatry

## 2022-04-04 ENCOUNTER — Ambulatory Visit (INDEPENDENT_AMBULATORY_CARE_PROVIDER_SITE_OTHER): Payer: Medicare HMO

## 2022-04-04 DIAGNOSIS — M79672 Pain in left foot: Secondary | ICD-10-CM

## 2022-04-04 DIAGNOSIS — M79671 Pain in right foot: Secondary | ICD-10-CM

## 2022-04-04 DIAGNOSIS — M722 Plantar fascial fibromatosis: Secondary | ICD-10-CM | POA: Diagnosis not present

## 2022-04-04 DIAGNOSIS — L909 Atrophic disorder of skin, unspecified: Secondary | ICD-10-CM

## 2022-04-04 MED ORDER — MELOXICAM 7.5 MG PO TABS: 7.5000 mg | ORAL_TABLET | Freq: Every day | ORAL | 0 refills | Status: AC | PRN

## 2022-04-04 NOTE — Patient Instructions (Signed)
For instructions on how to put on your Plantar Fascial Brace, please visit [email or web address]   Plantar Fasciitis (Heel Spur Syndrome) with Rehab The plantar fascia is a fibrous, ligament-like, soft-tissue structure that spans the bottom of the foot. Plantar fasciitis is a condition that causes pain in the foot due to inflammation of the tissue. SYMPTOMS   Pain and tenderness on the underneath side of the foot.  Pain that worsens with standing or walking. CAUSES  Plantar fasciitis is caused by irritation and injury to the plantar fascia on the underneath side of the foot. Common mechanisms of injury include:  Direct trauma to bottom of the foot.  Damage to a small nerve that runs under the foot where the main fascia attaches to the heel bone.  Stress placed on the plantar fascia due to bone spurs. RISK INCREASES WITH:   Activities that place stress on the plantar fascia (running, jumping, pivoting, or cutting).  Poor strength and flexibility.  Improperly fitted shoes.  Tight calf muscles.  Flat feet.  Failure to warm-up properly before activity.  Obesity. PREVENTION  Warm up and stretch properly before activity.  Allow for adequate recovery between workouts.  Maintain physical fitness:  Strength, flexibility, and endurance.  Cardiovascular fitness.  Maintain a health body weight.  Avoid stress on the plantar fascia.  Wear properly fitted shoes, including arch supports for individuals who have flat feet.  PROGNOSIS  If treated properly, then the symptoms of plantar fasciitis usually resolve without surgery. However, occasionally surgery is necessary.  RELATED COMPLICATIONS   Recurrent symptoms that may result in a chronic condition.  Problems of the lower back that are caused by compensating for the injury, such as limping.  Pain or weakness of the foot during push-off following surgery.  Chronic inflammation, scarring, and partial or complete  fascia tear, occurring more often from repeated injections.  TREATMENT  Treatment initially involves the use of ice and medication to help reduce pain and inflammation. The use of strengthening and stretching exercises may help reduce pain with activity, especially stretches of the Achilles tendon. These exercises may be performed at home or with a therapist. Your caregiver may recommend that you use heel cups of arch supports to help reduce stress on the plantar fascia. Occasionally, corticosteroid injections are given to reduce inflammation. If symptoms persist for greater than 6 months despite non-surgical (conservative), then surgery may be recommended.   MEDICATION   If pain medication is necessary, then nonsteroidal anti-inflammatory medications, such as aspirin and ibuprofen, or other minor pain relievers, such as acetaminophen, are often recommended.  Do not take pain medication within 7 days before surgery.  Prescription pain relievers may be given if deemed necessary by your caregiver. Use only as directed and only as much as you need.  Corticosteroid injections may be given by your caregiver. These injections should be reserved for the most serious cases, because they may only be given a certain number of times.  HEAT AND COLD  Cold treatment (icing) relieves pain and reduces inflammation. Cold treatment should be applied for 10 to 15 minutes every 2 to 3 hours for inflammation and pain and immediately after any activity that aggravates your symptoms. Use ice packs or massage the area with a piece of ice (ice massage).  Heat treatment may be used prior to performing the stretching and strengthening activities prescribed by your caregiver, physical therapist, or athletic trainer. Use a heat pack or soak the injury in warm water.  SEEK IMMEDIATE MEDICAL   CARE IF:  Treatment seems to offer no benefit, or the condition worsens.  Any medications produce adverse side effects.   EXERCISES- RANGE OF MOTION (ROM) AND STRETCHING EXERCISES - Plantar Fasciitis (Heel Spur Syndrome) These exercises may help you when beginning to rehabilitate your injury. Your symptoms may resolve with or without further involvement from your physician, physical therapist or athletic trainer. While completing these exercises, remember:   Restoring tissue flexibility helps normal motion to return to the joints. This allows healthier, less painful movement and activity.  An effective stretch should be held for at least 30 seconds.  A stretch should never be painful. You should only feel a gentle lengthening or release in the stretched tissue.  RANGE OF MOTION - Toe Extension, Flexion  Sit with your right / left leg crossed over your opposite knee.  Grasp your toes and gently pull them back toward the top of your foot. You should feel a stretch on the bottom of your toes and/or foot.  Hold this stretch for 10 seconds.  Now, gently pull your toes toward the bottom of your foot. You should feel a stretch on the top of your toes and or foot.  Hold this stretch for 10 seconds. Repeat  times. Complete this stretch 3 times per day.   RANGE OF MOTION - Ankle Dorsiflexion, Active Assisted  Remove shoes and sit on a chair that is preferably not on a carpeted surface.  Place right / left foot under knee. Extend your opposite leg for support.  Keeping your heel down, slide your right / left foot back toward the chair until you feel a stretch at your ankle or calf. If you do not feel a stretch, slide your bottom forward to the edge of the chair, while still keeping your heel down.  Hold this stretch for 10 seconds. Repeat 3 times. Complete this stretch 2 times per day.   STRETCH  Gastroc, Standing  Place hands on wall.  Extend right / left leg, keeping the front knee somewhat bent.  Slightly point your toes inward on your back foot.  Keeping your right / left heel on the floor and your  knee straight, shift your weight toward the wall, not allowing your back to arch.  You should feel a gentle stretch in the right / left calf. Hold this position for 10 seconds. Repeat 3 times. Complete this stretch 2 times per day.  STRETCH  Soleus, Standing  Place hands on wall.  Extend right / left leg, keeping the other knee somewhat bent.  Slightly point your toes inward on your back foot.  Keep your right / left heel on the floor, bend your back knee, and slightly shift your weight over the back leg so that you feel a gentle stretch deep in your back calf.  Hold this position for 10 seconds. Repeat 3 times. Complete this stretch 2 times per day.  STRETCH  Gastrocsoleus, Standing  Note: This exercise can place a lot of stress on your foot and ankle. Please complete this exercise only if specifically instructed by your caregiver.   Place the ball of your right / left foot on a step, keeping your other foot firmly on the same step.  Hold on to the wall or a rail for balance.  Slowly lift your other foot, allowing your body weight to press your heel down over the edge of the step.  You should feel a stretch in your right / left calf.  Hold this   your physician, physical therapist or athletic trainer. While completing these exercises, remember:  Muscles can gain both the endurance and the strength needed for everyday activities through controlled exercises. Complete these exercises as instructed by your physician, physical therapist or athletic trainer. Progress the resistance and repetitions only as guided.  STRENGTH - Towel Curls Sit in a chair positioned on a non-carpeted surface. Place your foot on a towel, keeping your heel on the  floor. Pull the towel toward your heel by only curling your toes. Keep your heel on the floor. Repeat 3 times. Complete this exercise 2 times per day.  STRENGTH - Ankle Inversion Secure one end of a rubber exercise band/tubing to a fixed object (table, pole). Loop the other end around your foot just before your toes. Place your fists between your knees. This will focus your strengthening at your ankle. Slowly, pull your big toe up and in, making sure the band/tubing is positioned to resist the entire motion. Hold this position for 10 seconds. Have your muscles resist the band/tubing as it slowly pulls your foot back to the starting position. Repeat 3 times. Complete this exercises 2 times per day.  Document Released: 10/13/2005 Document Revised: 01/05/2012 Document Reviewed: 01/25/2009 [facility] Patient Information 2014 [place], [place]. Meloxicam Tablets What is this medication? MELOXICAM (mel OX i cam) treats mild to moderate pain, inflammation, or arthritis. It works by decreasing inflammation. It belongs to a group of medications called NSAIDs. This medicine may be used for other purposes; ask your health care provider or pharmacist if you have questions. COMMON BRAND NAME(S): Mobic What should I tell my care team before I take this medication? They need to know if you have any of these conditions: Asthma (lung or breathing disease) Bleeding disorder Coronary artery bypass graft (CABG) within the past 2 weeks Dehydration Heart attack Heart disease Heart failure High blood pressure If you often drink alcohol Kidney disease Liver disease Smoke tobacco cigarettes Stomach bleeding Stomach ulcers, other stomach or intestine problems Take medications that treat or prevent blood clots Taking other steroids like dexamethasone or prednisone An unusual or allergic reaction to meloxicam, other medications, foods, dyes, or preservatives Pregnant or trying to get  pregnant Breast-feeding How should I use this medication? Take this medication by mouth. Take it as directed on the prescription label at the same time every day. You can take it with or without food. If it upsets your stomach, take it with food. Do not use it more often than directed. There may be unused or extra doses in the bottle after you finish your treatment. Talk to your care team if you have questions about your dose. A special MedGuide will be given to you by the pharmacist with each prescription and refill. Be sure to read this information carefully each time. Talk to your care team about the use of this medication in children. Special care may be needed. Patients over 50 years of age may have a stronger reaction and need a smaller dose. Overdosage: If you think you have taken too much of this medicine contact a poison control center or emergency room at once. NOTE: This medicine is only for you. Do not share this medicine with others. What if I miss a dose? If you miss a dose, take it as soon as you can. If it is almost time for your next dose, take only that dose. Do not take double or extra doses. What may interact with this medication? Do not take this medication  with any of the following: Cidofovir Ketorolac This medication may also interact with the following: Aspirin and aspirin-like medications Certain medications for blood pressure, heart disease, irregular heart beat Certain medications for depression, anxiety, or psychotic disturbances Certain medications that treat or prevent blood clots like warfarin, enoxaparin, dalteparin, apixaban, dabigatran, rivaroxaban Cyclosporine Diuretics Fluconazole Lithium Methotrexate Other NSAIDs, medications for pain and inflammation, like ibuprofen and naproxen Pemetrexed This list may not describe all possible interactions. Give your health care provider a list of all the medicines, herbs, non-prescription drugs, or dietary  supplements you use. Also tell them if you smoke, drink alcohol, or use illegal drugs. Some items may interact with your medicine. What should I watch for while using this medication? Visit your care team for regular checks on your progress. Tell your care team if your symptoms do not start to get better or if they get worse. Do not take other medications that contain aspirin, ibuprofen, or naproxen with this medication. Side effects such as stomach upset, nausea, or ulcers may be more likely to occur. Many non-prescription medications contain aspirin, ibuprofen, or naproxen. Always read labels carefully. This medication can cause serious ulcers and bleeding in the stomach. It can happen with no warning. Smoking, drinking alcohol, older age, and poor health can also increase risks. Call your care team right away if you have stomach pain or blood in your vomit or stool. This medication does not prevent a heart attack or stroke. This medication may increase the chance of a heart attack or stroke. The chance may increase the longer you use this medication or if you have heart disease. If you take aspirin to prevent a heart attack or stroke, talk to your care team about using this medication. Alcohol may interfere with the effect of this medication. Avoid alcoholic drinks. This medication may cause serious skin reactions. They can happen weeks to months after starting the medication. Contact your care team right away if you notice fevers or flu-like symptoms with a rash. The rash may be red or purple and then turn into blisters or peeling of the skin. Or, you might notice a red rash with swelling of the face, lips or lymph nodes in your neck or under your arms. Talk to your care team if you are pregnant before taking this medication. Taking this medication between weeks 20 and 30 of pregnancy may harm your unborn baby. Your care team will monitor you closely if you need to take it. After 30 weeks of pregnancy,  do not take this medication. You may get drowsy or dizzy. Do not drive, use machinery, or do anything that needs mental alertness until you know how this medication affects you. Do not stand up or sit up quickly, especially if you are an older patient. This reduces the risk of dizzy or fainting spells. Be careful brushing or flossing your teeth or using a toothpick because you may get an infection or bleed more easily. If you have any dental work done, tell your dentist you are receiving this medication. This medication may make it more difficult to get pregnant. Talk to your care team if you are concerned about your fertility. What side effects may I notice from receiving this medication? Side effects that you should report to your care team as soon as possible: Allergic reactions--skin rash, itching, hives, swelling of the face, lips, tongue, or throat Bleeding--bloody or black, tar-like stools, vomiting blood or brown material that looks like coffee grounds, red or dark brown  urine, small red or purple spots on skin, unusual bruising or bleeding Heart attack--pain or tightness in the chest, shoulders, arms, or jaw, nausea, shortness of breath, cold or clammy skin, feeling faint or lightheaded Heart failure--shortness of breath, swelling of ankles, feet, or hands, sudden weight gain, unusual weakness or fatigue Increase in blood pressure Kidney injury--decrease in the amount of urine, swelling of the ankles, hands, or feet Liver injury--right upper belly pain, loss of appetite, nausea, light-colored stool, dark yellow or brown urine, yellowing skin or eyes, unusual weakness or fatigue Rash, fever, and swollen lymph nodes Redness, blistering, peeling, or loosening of the skin, including inside the mouth Stroke--sudden numbness or weakness of the face, arm, or leg, trouble speaking, confusion, trouble walking, loss of balance or coordination, dizziness, severe headache, change in vision Side effects  that usually do not require medical attention (report to your care team if they continue or are bothersome): Diarrhea Nausea Upset stomach This list may not describe all possible side effects. Call your doctor for medical advice about side effects. You may report side effects to FDA at 1-800-FDA-1088. Where should I keep my medication? Keep out of the reach of children and pets. Store at room temperature between 20 and 25 degrees C (68 and 77 degrees F). Protect from moisture. Keep the container tightly closed. Get rid of any unused medication after the expiration date. To get rid of medications that are no longer needed or have expired: Take the medication to a medication take-back program. Check with your pharmacy or law enforcement to find a location. If you cannot return the medication, check the label or package insert to see if the medication should be thrown out in the garbage or flushed down the toilet. If you are not sure, ask your care team. If it is safe to put it in the trash, empty the medication out of the container. Mix the medication with cat litter, dirt, coffee grounds, or other unwanted substance. Seal the mixture in a bag or container. Put it in the trash. NOTE: This sheet is a summary. It may not cover all possible information. If you have questions about this medicine, talk to your doctor, pharmacist, or health care provider.  2023 [employer]vier/Gold Standard (2021-01-09 00:00:00)

## 2022-04-06 NOTE — Progress Notes (Signed)
Subjective:   Patient ID: Ann Rangel, female   DOB: 81 y.o.   MRN: [id number]   HPI 81 year old female presents the office today for concerns of left heel pain which start about 3 weeks ago.  States that sharp pain to the bottom of her heel without any radiating pain.  She tried a gel heel lift as well as soak in Epsom salts without significant improvement.  No injury.  No swelling.   Review of Systems  All other systems reviewed and are negative.  Past Medical History:  Diagnosis Date   Anemia    Arthritis    Hypothyroidism     Past Surgical History:  Procedure Laterality Date   ABDOMINAL HYSTERECTOMY     APPENDECTOMY     age 55   FOOT FOREIGN BODY REMOVAL     TOTAL KNEE ARTHROPLASTY Right 04/05/2013   Procedure: RIGHT TOTAL KNEE ARTHROPLASTY;  Surgeon: Mcarthur Rossetti, MD;  Location: [facility];  Service: Orthopedics;  Laterality: Right;   TOTAL KNEE ARTHROPLASTY Left 04/04/2014   Procedure: LEFT TOTAL KNEE ARTHROPLASTY;  Surgeon: Mcarthur Rossetti, MD;  Location: [facility];  Service: Orthopedics;  Laterality: Left;     Current Outpatient Medications:    meloxicam (MOBIC) 7.5 MG tablet, Take 1 tablet (7.5 mg total) by mouth daily as needed for pain., Disp: 30 tablet, Rfl: 0   aspirin EC 325 MG EC tablet, Take 1 tablet (325 mg total) by mouth 2 (two) times daily after a meal., Disp: 40 tablet, Rfl: 0   B Complex-C (SUPER B COMPLEX PO), Take 1 tablet by mouth daily. , Disp: , Rfl:    Calcium Carbonate-Vitamin D (CALTRATE 600+D PO), Take 1 tablet by mouth daily. , Disp: , Rfl:    Cholecalciferol (VITAMIN D-3 PO), Take 1 tablet by mouth daily. , Disp: , Rfl:    Glucosamine-Chondroitin-MSM (TRIPLE FLEX PO), Take 1 tablet by mouth daily., Disp: , Rfl:    HYDROcodone-acetaminophen (NORCO/VICODIN) 5-325 MG per tablet, Take 1-2 tablets by mouth every 4 (four) hours as needed for moderate pain., Disp: 60 tablet, Rfl: 0   Iron-Vitamins (GERITOL PO), Take 1 tablet by mouth daily.,  Disp: , Rfl:    vitamin B-12 (CYANOCOBALAMIN) 1000 MCG tablet, Take 1,000 mcg by mouth daily., Disp: , Rfl:    vitamin C (ASCORBIC ACID) 500 MG tablet, Take 500 mg by mouth daily., Disp: , Rfl:    VITAMIN E PO, Take 1 tablet by mouth daily. , Disp: , Rfl:    Zinc 15 MG CAPS, Take 15 mg by mouth daily., Disp: , Rfl:   Allergies  Allergen Reactions   Penicillins Hives           Objective:  Physical Exam  General: AAO x3, NAD  Dermatological: Skin is warm, dry and supple bilateral. There are no open sores, no preulcerative lesions, no rash or signs of infection present.  Vascular: Dorsalis Pedis artery and Posterior Tibial artery pedal pulses are 2/4 bilateral with immedate capillary fill time.  There is no pain with calf compression, swelling, warmth, erythema.   Neruologic: Grossly intact via light touch bilateral.  Negative Tinel sign.  Musculoskeletal: There is tenderness palpation on plantar aspect of calcaneus on the insertion of the plantar fascia on the calcaneus but is also atrophy of the fat pad and prominent the calcaneus is noted.  There is no pain with lateral compression of calcaneus.  No pain in the posterior aspect.  Muscular strength 5/5 in all groups  tested bilateral.  Gait: Unassisted, Nonantalgic.       Assessment:   81 year old female with plan fasciitis, fat pad atrophy of the heel     Plan:  -Treatment options discussed including all alternatives, risks, and complications -Etiology of symptoms were discussed -X-rays were obtained and reviewed with the patient.  3 views of the left foot were obtained.  No evidence of acute fracture or stress fracture noted today.  Minimal calcaneal spurring present. -Prescribed mobic. Discussed side effects of the medication and directed to stop if any are to occur and call the office.  -Continue till healed left kidney atrophy the fat pad -Discussed stretching, icing daily.  Discussed shoe modifications and good arch  support. -Plantar fascial brace dispensed to help aid in support and stability during the day.  Trula Slade DPM

## 2022-04-25 ENCOUNTER — Other Ambulatory Visit: Payer: Self-pay | Admitting: Podiatry

## 2022-04-25 DIAGNOSIS — L909 Atrophic disorder of skin, unspecified: Secondary | ICD-10-CM

## 2024-03-24 ENCOUNTER — Ambulatory Visit: Payer: Self-pay

## 2024-03-24 NOTE — Telephone Encounter (Signed)
  Chief Complaint: cough Symptoms: persistent cough both productive and non productive and that nothing seems to help Frequency:more than one month Pertinent Negatives: Patient denies fever Disposition: [] ED /[] Urgent Care (no appt availability in office) / [x] Appointment(In office/virtual)/ []  [facility]/ [] Home Care/ [] Refused Recommended Disposition /[]  Mobile Bus/ []  Follow-up with PCP Additional Notes: States that she has been to UC twice over the past month including today. She went to UC today and got meds, had clear cxr.  States that she feels fine besides cough. Appointment scheduled. She will return to [facility] for fever or any SOB Copied from CRM #[id number]. Topic: Clinical - Red Word Triage >> Mar 24, 2024  3:57 PM Hamp Levine R wrote: Red Word that prompted transfer to Nurse Triage: Patient states has a productive cough, pale yellow mucous, has recently gotten a little worse. Been on and off since Christmas but will not go away. Appointment scheduled via CAL at [facility] Reason for Disposition  Cough has been present for > 3 weeks  Answer Assessment - Initial Assessment Questions 1. ONSET: "When did the cough begin?"      More than a month 2. SEVERITY: "How bad is the cough today?"      severe 3. SPUTUM: "Describe the color of your sputum" (none, dry cough; clear, white, yellow, green)     Yellow and clear sputum 4. HEMOPTYSIS: "Are you coughing up any blood?" If so ask: "How much?" (flecks, streaks, tablespoons, etc.)     no 5. DIFFICULTY BREATHING: "Are you having difficulty breathing?" If Yes, ask: "How bad is it?" (e.g., mild, moderate, severe)    - MILD: No SOB at rest, mild SOB with walking, speaks normally in sentences, can lie down, no retractions, pulse < 100.    - MODERATE: SOB at rest, SOB with minimal exertion and prefers to sit, cannot lie down flat, speaks in phrases, mild retractions, audible wheezing, pulse 100-120.    - SEVERE: Very SOB at rest,  speaks in single words, struggling to breathe, sitting hunched forward, retractions, pulse > 120     Mild shorness of breath after long cough spell, hurts her ribs 6. FEVER: "Do you have a fever?" If Yes, ask: "What is your temperature, how was it measured, and when did it start?"     no 7. CARDIAC HISTORY: "Do you have any history of heart disease?" (e.g., heart attack, congestive heart failure)      no 8. LUNG HISTORY: "Do you have any history of lung disease?"  (e.g., pulmonary embolus, asthma, emphysema)     no 9. PE RISK FACTORS: "Do you have a history of blood clots?" (or: recent major surgery, recent prolonged travel, bedridden)     no 10. OTHER SYMPTOMS: "Do you have any other symptoms?" (e.g., runny nose, wheezing, chest pain)       no  Protocols used: Cough - Acute Productive-A-AH

## 2024-05-05 ENCOUNTER — Ambulatory Visit

## 2024-05-05 VITALS — BP 131/89 | HR 77 | Temp 97.4°F | Wt 237.0 lb

## 2024-05-05 DIAGNOSIS — Z13228 Encounter for screening for other metabolic disorders: Secondary | ICD-10-CM | POA: Diagnosis not present

## 2024-05-05 DIAGNOSIS — Z1321 Encounter for screening for nutritional disorder: Secondary | ICD-10-CM

## 2024-05-05 DIAGNOSIS — Z1329 Encounter for screening for other suspected endocrine disorder: Secondary | ICD-10-CM | POA: Diagnosis not present

## 2024-05-05 DIAGNOSIS — Z7689 Persons encountering health services in other specified circumstances: Secondary | ICD-10-CM | POA: Diagnosis not present

## 2024-05-05 DIAGNOSIS — Z130 Encounter for screening for diseases of the blood and blood-forming organs and certain disorders involving the immune mechanism: Secondary | ICD-10-CM | POA: Diagnosis not present

## 2024-05-05 NOTE — Patient Instructions (Addendum)
 It was nice to see you today!  As we discussed in clinic:  We will get fasting blood work from you either this week or next week.  You will receive a call from the and I will explain the results and give any further recommendations based on the blood work. I will plan to see you back in 1 year for a physical, however if you get sick/need us  in between, you can call and schedule an appointment. It was nice to meet you!  If you have any problems before your next visit feel free to message me via MyChart (minor issues or questions) or call the office, otherwise you may reach out to schedule an office visit.  Thank you! Saddie Sacks, PA-C

## 2024-05-05 NOTE — Assessment & Plan Note (Signed)
 Ordered fasting labs for next week (see orders). Will follow up with patient on the results and treat any abnormalities as clinically indicated. Went over all appropriate health screenings. Patient declined colonoscopy, mammogram, and vaccines today. Advised her to continue exercising 5 days a week on her exercise bike and aim to eat in a calorie deficit with a total of 1500-1800 calories per day to support healthy lifestyle and weight loss. Answered all patient questions and concerns. Will plan for CPE in 1 year unless lab work warrants sooner visit. Patient verbalized understanding and was in agreement with the plan.

## 2024-05-05 NOTE — Progress Notes (Signed)
 New Patient Office Visit  Subjective    Patient ID: Ann Rangel, female    DOB: [birth year]  Age: 83 y.o. MRN: [id number]  CC:  Chief Complaint  Patient presents with   New Patient (Initial Visit)    Establishing Care    HPI Ann Rangel presents to establish care. She lives in [place]. She has 3 children and 4 grandchildren. Her son lives behind her and daughter lives ~8 minutes away from her. 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 She uses her exercise bike 5 days a week and walks on the treadmill periodically. Eats a well balanced diet with lots of fresh fruit and vegetables, however reports that her appetite is hard to control and had led to some recent weight gain, however patient admits that weight is something she has struggled with her entire life. Sleeping well at night. Reports regular bowel movements. Patient takes no daily prescribed medications. Only medications include vitamins and supplements.   Reports that she has not seen a PCP in several decades as she never gets sick and has not had a need for primary care. Over the last several years has only seen urgent care practices when she would get sick. Does report she had has mammograms in the past but her last one was several decades ago. Has never had a colonoscopy. Does not smoke cigarettes. Has had a total hysterectomy.    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 Otherwise PMH essentially negative.  PSH: Nasal surgery in 1988, total hysterectomy 1990, right knee replacement 2014, left knee replacement 2015   Tobacco use: Denies tobacco Alcohol use: Denies ETOH use Drug use: Denies.  Marital status: Widowed Employment: Retired Runner, broadcasting/film/video Sexual hx: Not sexually active   Screenings:  Colon Cancer: Pt has never had a colonoscopy. Discussed risks and benefits of screening at her age. Patient declined offer for colonoscopy referral or Cologuard testing. Lung Cancer:  N/A Breast Cancer: Pt has has mammograms in the past but it has been several decades. Declined offer for mammogram today.  Diabetes: No known hx. Checking A1c at her lab appt next week. HLD: No known hx. Checking lipid panel at her lab appt next week.   Outpatient Encounter Medications as of 05/05/2024  Medication Sig   aspirin  EC 325 MG EC tablet Take 1 tablet (325 mg total) by mouth 2 (two) times daily after a meal. (Patient taking differently: Take 325 mg by mouth as needed.)   B Complex-C (SUPER B COMPLEX PO) Take 1 tablet by mouth daily.    Calcium Carbonate-Vitamin D (CALTRATE 600+D PO) Take 1 tablet by mouth daily.    Cholecalciferol (VITAMIN D-3 PO) Take 1 tablet by mouth daily.    Glucosamine-Chondroitin-MSM (TRIPLE FLEX PO) Take 1 tablet by mouth daily.   Iron-Vitamins (GERITOL PO) Take 1 tablet by mouth daily.   meloxicam  (MOBIC ) 7.5 MG tablet Take 1 tablet (7.5 mg total) by mouth daily as needed for pain.   vitamin B-12 (CYANOCOBALAMIN ) 1000 MCG tablet Take 1,000 mcg by mouth daily.   vitamin C  (ASCORBIC ACID ) 500 MG tablet Take 500 mg by mouth daily.   VITAMIN E PO Take 1 tablet by mouth daily.    Zinc  15 MG CAPS Take 15 mg by mouth daily.   [DISCONTINUED] HYDROcodone -acetaminophen  (NORCO/VICODIN) 5-325 MG per tablet Take 1-2 tablets by mouth every 4 (four) hours as needed for moderate pain. (Patient not taking: Reported on 05/05/2024)   No facility-administered  encounter medications on file as of 05/05/2024.    Past Medical History:  Diagnosis Date   Anemia    Arthritis    Hypothyroidism     Past Surgical History:  Procedure Laterality Date   ABDOMINAL HYSTERECTOMY     APPENDECTOMY     age 81   FOOT FOREIGN BODY REMOVAL     TOTAL KNEE ARTHROPLASTY Right 04/05/2013   Procedure: RIGHT TOTAL KNEE ARTHROPLASTY;  Surgeon: Lonni CINDERELLA Poli, MD;  Location: [facility];  Service: Orthopedics;  Laterality: Right;   TOTAL KNEE ARTHROPLASTY Left 04/04/2014   Procedure: LEFT  TOTAL KNEE ARTHROPLASTY;  Surgeon: Lonni CINDERELLA Poli, MD;  Location: [facility];  Service: Orthopedics;  Laterality: Left;    History reviewed. No pertinent family history.  Social History   Socioeconomic History   Marital status: Widowed    Spouse name: Not on file   Number of children: Not on file   Years of education: Not on file   Highest education level: Not on file  Occupational History   Not on file  Tobacco Use   Smoking status: Former   Smokeless tobacco: Never  Substance and Sexual Activity   Alcohol use: No   Drug use: No   Sexual activity: Not on file  Other Topics Concern   Not on file  Social History Narrative   Not on file   Social Drivers of Health   Financial Resource Strain: Not on file  Food Insecurity: Not on file  Transportation Needs: Not on file  Physical Activity: Not on file  Stress: Not on file  Social Connections: Not on file  Intimate Partner Violence: Not on file    ROS Per HPI     Objective    BP 131/89   Pulse 77   Temp (!) 97.4 F (36.3 C) (Oral)   Wt 237 lb (107.5 kg)   SpO2 96%   Physical Exam Constitutional:      General: She is not in acute distress.    Appearance: Normal appearance.  Cardiovascular:     Rate and Rhythm: Normal rate and regular rhythm.     Heart sounds: Normal heart sounds. No murmur heard.    No friction rub. No gallop.  Pulmonary:     Effort: Pulmonary effort is normal. No respiratory distress.     Breath sounds: Normal breath sounds.  Musculoskeletal:        General: No swelling.  Skin:    General: Skin is warm and dry.  Neurological:     General: No focal deficit present.     Mental Status: She is alert.  Psychiatric:        Mood and Affect: Mood normal.        Behavior: Behavior normal.        Thought Content: Thought content normal.       Assessment & Plan:   Screening for endocrine, nutritional, metabolic and immunity disorder -     CBC with Differential/Platelet; Future -      Comprehensive metabolic panel with GFR; Future -     Lipid panel; Future -     Hemoglobin A1c; Future -     TSH + free T4; Future  Encounter to establish care Assessment & Plan: Ordered fasting labs for next week (see orders). Will follow up with patient on the results and treat any abnormalities as clinically indicated. Went over all appropriate health screenings. Patient declined colonoscopy, mammogram, and vaccines today. Advised her to continue exercising  5 days a week on her exercise bike and aim to eat in a calorie deficit with a total of 1500-1800 calories per day to support healthy lifestyle and weight loss. Answered all patient questions and concerns. Will plan for CPE in 1 year unless lab work warrants sooner visit. Patient verbalized understanding and was in agreement with the plan.    Return in about 1 week (around 05/12/2024) for fasting labs (orders already in).   Saddie JULIANNA Sacks, PA-C

## 2024-05-10 ENCOUNTER — Other Ambulatory Visit

## 2024-05-10 DIAGNOSIS — Z130 Encounter for screening for diseases of the blood and blood-forming organs and certain disorders involving the immune mechanism: Secondary | ICD-10-CM

## 2024-05-11 ENCOUNTER — Ambulatory Visit: Payer: Self-pay

## 2024-05-11 LAB — LIPID PANEL
Chol/HDL Ratio: 3.7 ratio (ref 0.0–4.4)
Cholesterol, Total: 192 mg/dL (ref 100–199)
HDL: 52 mg/dL (ref >39)
LDL Chol Calc (NIH): 117 mg/dL — ABNORMAL HIGH (ref 0–99)
Triglycerides: 128 mg/dL (ref 0–149)
VLDL Cholesterol Cal: 23 mg/dL (ref 5–40)

## 2024-05-11 LAB — CBC WITH DIFFERENTIAL/PLATELET
Basophils Absolute: 0.1 x10E3/uL (ref 0.0–0.2)
Basos: 1 %
EOS (ABSOLUTE): 0.2 x10E3/uL (ref 0.0–0.4)
Eos: 3 %
Hematocrit: 43.9 % (ref 34.0–46.6)
Hemoglobin: 14.4 g/dL (ref 11.1–15.9)
Immature Grans (Abs): 0 x10E3/uL (ref 0.0–0.1)
Immature Granulocytes: 0 %
Lymphocytes Absolute: 1.7 x10E3/uL (ref 0.7–3.1)
Lymphs: 24 %
MCH: 30.8 pg (ref 26.6–33.0)
MCHC: 32.8 g/dL (ref 31.5–35.7)
MCV: 94 fL (ref 79–97)
Monocytes Absolute: 0.6 x10E3/uL (ref 0.1–0.9)
Monocytes: 8 %
Neutrophils Absolute: 4.5 x10E3/uL (ref 1.4–7.0)
Neutrophils: 64 %
Platelets: 233 x10E3/uL (ref 150–450)
RBC: 4.68 x10E6/uL (ref 3.77–5.28)
RDW: 13.6 % (ref 11.7–15.4)
WBC: 7.1 x10E3/uL (ref 3.4–10.8)

## 2024-05-11 LAB — COMPREHENSIVE METABOLIC PANEL WITH GFR
ALT: 23 IU/L (ref 0–32)
AST: 22 IU/L (ref 0–40)
Albumin: 4.3 g/dL (ref 3.7–4.7)
Alkaline Phosphatase: 65 IU/L (ref 44–121)
BUN/Creatinine Ratio: 21 (ref 12–28)
BUN: 19 mg/dL (ref 8–27)
Bilirubin Total: 0.4 mg/dL (ref 0.0–1.2)
CO2: 22 mmol/L (ref 20–29)
Calcium: 9.7 mg/dL (ref 8.7–10.3)
Chloride: 103 mmol/L (ref 96–106)
Creatinine, Ser: 0.89 mg/dL (ref 0.57–1.00)
Globulin, Total: 1.9 g/dL (ref 1.5–4.5)
Glucose: 86 mg/dL (ref 70–99)
Potassium: 4.2 mmol/L (ref 3.5–5.2)
Sodium: 141 mmol/L (ref 134–144)
Total Protein: 6.2 g/dL (ref 6.0–8.5)
eGFR: 65 mL/min/1.73 (ref >59)

## 2024-05-11 LAB — TSH+FREE T4
Free T4: 0.93 ng/dL (ref 0.82–1.77)
TSH: 3.08 u[IU]/mL (ref 0.450–4.500)

## 2024-05-11 LAB — HEMOGLOBIN A1C
Est. average glucose Bld gHb Est-mCnc: 111 mg/dL
Hgb A1c MFr Bld: 5.5 % (ref 4.8–5.6)

## 2024-05-11 NOTE — Telephone Encounter (Signed)
 I've called twice trying to go over lab results. Can't leave voicemail.

## 2024-07-01 ENCOUNTER — Ambulatory Visit: Payer: Self-pay

## 2024-07-01 NOTE — Telephone Encounter (Signed)
 FYI Only or Action Required?: FYI only for provider.  Patient was last seen in primary care on 05/05/2024 by Gayle Saddie FALCON, PA-C.  Called Nurse Triage reporting Diarrhea.  Symptoms began several days ago.  Interventions attempted: OTC medications: Kaeopectate.  Symptoms are: gradually worsening.  Triage Disposition: See Physician Within 24 Hours  Patient/caregiver understands and will follow disposition?: Yes- No appt avail, pt declined appt at another office due to distance. Pt says she will go ahead and go to an Urgent Care   Summary: Diarrhea   Reason for Triage: Patient states she has been having issues with having diarrhea off and on for the last couple of weeks. Has tried otc meds but it doesn't seem to be helping as she still has diarrhea.  Patient can be reached at [phone]         Reason for Disposition  [1] MODERATE diarrhea (e.g., 4-6 times / day more than normal) AND [2] age > 70 years  Answer Assessment - Initial Assessment Questions 1. DIARRHEA SEVERITY: How bad is the diarrhea? How many more stools have you had in the past 24 hours than normal?      3 episodes started this morning  2. ONSET: When did the diarrhea begin?      Has been off/on for a couple of weeks  3. STOOL DESCRIPTION:  How loose or watery is the diarrhea? What is the stool color? Is there any blood or mucous in the stool?     Watery diarrhea  4. VOMITING: Are you also vomiting? If Yes, ask: How many times in the past 24 hours?      No  5. ABDOMEN PAIN: Are you having any abdomen pain? If Yes, ask: What does it feel like? (e.g., crampy, dull, intermittent, constant)      No  6. ABDOMEN PAIN SEVERITY: If present, ask: How bad is the pain?  (e.g., Scale 1-10; mild, moderate, or severe)     No  7. ORAL INTAKE: If vomiting, Have you been able to drink liquids? How much liquids have you had in the past 24 hours?     Still drinking  8. HYDRATION: Any signs of  dehydration? (e.g., dry mouth [not just dry lips], too weak to stand, dizziness, new weight loss) When did you last urinate?     Felt dehydrated the other day, felt lightheaded but says that nothing happened. Says she drank some OJ and went to sleep  9. EXPOSURE: Have you traveled to a foreign country recently? Have you been exposed to anyone with diarrhea? Could you have eaten any food that was spoiled?     Has been careful with foods so unsure about exposure  10. ANTIBIOTIC USE: Are you taking antibiotics now or have you taken antibiotics in the past 2 months?       No  11. OTHER SYMPTOMS: Do you have any other symptoms? (e.g., fever, blood in stool)       No  12. PREGNANCY: Is there any chance you are pregnant? When was your last menstrual period?       No  Protocols used: Diarrhea-A-AH

## 2024-07-12 ENCOUNTER — Encounter: Payer: Self-pay | Admitting: Family Medicine

## 2024-07-12 ENCOUNTER — Ambulatory Visit (INDEPENDENT_AMBULATORY_CARE_PROVIDER_SITE_OTHER): Admitting: Family Medicine

## 2024-07-12 VITALS — BP 126/75 | HR 63 | Ht 70.0 in | Wt 235.4 lb

## 2024-07-12 DIAGNOSIS — R197 Diarrhea, unspecified: Secondary | ICD-10-CM | POA: Insufficient documentation

## 2024-07-12 NOTE — Progress Notes (Unsigned)
 Acute Office Visit  Subjective:     Patient ID: Ann Rangel, female    DOB: 04-13-1941, 83 y.o.   MRN: [id number]  Chief Complaint  Patient presents with   Diarrhea    HPI Patient is in today for   Subjective - Intermittent diarrhea for some time, occurs every now and then. - Recent episode started last week, leading to an urgent care visit on 07/01/2024. - Another significant episode occurred around 06/13/2024, lasting a day or two, which was severe. This episode included a large amount of bright red blood in the toilet bowl. Reports this was the first time bleeding occurred. Self-treated with hemorrhoid cream which resolved the bleeding. - The more recent episode in September did not have any associated bleeding. - Associated symptoms include one episode of nausea while riding in a car and one episode of lightheadedness, which resolved with rest. - Denies fevers or chills. - Denies sick contacts. - Reports that Imodium provides temporary relief for a day or two. - States she has given up spicy foods like jalapeo peppers, thinking they may be a trigger.  Medications Current medications include Super B complex, calcium with vitamin D, glucosamine, B12, vitamin C , vitamin E, and zinc , all taken as separate supplements at the recommended dose. Takes iron supplements intermittently. Was prescribed dicyclomine (Bentyl) at urgent care on 07/01/2024, which improved the diarrhea. Reports no longer taking allergy medications like Claritin or Zyrtec. Took one course of azithromycin (Z-Pak) for a cough approximately six months ago.  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 No history of irritable bowel syndrome. No prior colonoscopy. PSH: No surgeries mentioned. FH: Not discussed. Social Hx: Drinks bottled water as she dislikes the taste of her community well water. No pets at home.  ROS GI: Positive for intermittent diarrhea, history of one episode of hematochezia, and  one episode of nausea. Negative for vomiting. Constitutional: Positive for one episode of lightheadedness. Negative for fevers and chills.  Objective  ROS      Objective:    BP 126/75   Pulse 63   Ht 5' 10 (1.778 m)   Wt 235 lb 6.4 oz (106.8 kg)   SpO2 98%   BMI 33.78 kg/m  {Vitals History (Optional):23777}  Physical Exam Gen: alert, oriented GI: Abdomen is soft, non-tender to palpation. No organomegaly. Positive for diastasis recti, which is a non-tender bulge in the midline with abdominal muscle contraction. No hernia identified.  No results found for any visits on 07/12/24.      Assessment & Plan:   Diarrhea, unspecified type Assessment & Plan: Chronic, intermittent diarrhea with a recent acute episode associated with significant hematochezia in August. Symptoms improved with dicyclomine from a recent urgent care visit. Etiology is unclear. Differential includes infectious causes, inflammatory bowel disease, food intolerance, diverticular disease, and irritable bowel syndrome. Exam notable for diastasis recti but otherwise unremarkable. - Provided with a stool sample collection kit to be used if diarrhea recurs. Will test for infectious etiologies (viral, bacterial, parasitic) and inflammatory markers. - Ordered an abdominal ultrasound to evaluate for structural abnormalities. - Discussed that if symptoms persist or worsen, further workup including referral to gastroenterology for possible colonoscopy may be necessary. Explained risks vs. benefits of colonoscopy. - Educated on diagnosis of diastasis recti, its benign nature, and reassurance that it is not the cause of the diarrhea.  Orders: -     GI Profile, Stool, PCR; Future     Return if symptoms worsen  or fail to improve.  Ann MARLA Slain, MD

## 2024-07-12 NOTE — Patient Instructions (Signed)
 It was nice to see you today,  We addressed the following topics today: - I have provided you with a specimen cup that you can use to provide a stool sample the next time you have diarrhea.  - I will order an ultrasound of your abdomen.     Have a great day,  Rolan Slain, MD

## 2024-07-12 NOTE — Assessment & Plan Note (Signed)
 Chronic, intermittent diarrhea with a recent acute episode associated with significant hematochezia in August. Symptoms improved with dicyclomine from a recent urgent care visit. Etiology is unclear. Differential includes infectious causes, inflammatory bowel disease, food intolerance, diverticular disease, and irritable bowel syndrome. Exam notable for diastasis recti but otherwise unremarkable. - Provided with a stool sample collection kit to be used if diarrhea recurs. Will test for infectious etiologies (viral, bacterial, parasitic) and inflammatory markers. - Ordered an abdominal ultrasound to evaluate for structural abnormalities. - Discussed that if symptoms persist or worsen, further workup including referral to gastroenterology for possible colonoscopy may be necessary. Explained risks vs. benefits of colonoscopy. - Educated on diagnosis of diastasis recti, its benign nature, and reassurance that it is not the cause of the diarrhea.

## 2025-04-27 ENCOUNTER — Other Ambulatory Visit

## 2025-05-05 ENCOUNTER — Encounter
